# Patient Record
Sex: Male | Born: 1960 | Race: White | Hispanic: No | Marital: Single | Smoking: Former smoker
Health system: Southern US, Community
[De-identification: ages and names within clinical notes are randomized; demographics above are authoritative.]

## PROBLEM LIST (undated history)

## (undated) DIAGNOSIS — I10 Essential (primary) hypertension: Secondary | ICD-10-CM

## (undated) DIAGNOSIS — E119 Type 2 diabetes mellitus without complications: Secondary | ICD-10-CM

## (undated) HISTORY — PX: OTHER SURGICAL HISTORY: SHX169

---

## 2007-06-23 ENCOUNTER — Emergency Department: Payer: Self-pay | Admitting: Emergency Medicine

## 2007-06-23 ENCOUNTER — Other Ambulatory Visit: Payer: Self-pay

## 2010-09-25 ENCOUNTER — Ambulatory Visit: Payer: Self-pay | Admitting: Oncology

## 2010-10-03 ENCOUNTER — Ambulatory Visit: Payer: Self-pay | Admitting: Oncology

## 2010-10-07 ENCOUNTER — Ambulatory Visit: Payer: Self-pay | Admitting: Oncology

## 2010-10-08 ENCOUNTER — Ambulatory Visit: Payer: Self-pay | Admitting: Unknown Physician Specialty

## 2010-10-26 ENCOUNTER — Ambulatory Visit: Payer: Self-pay | Admitting: Oncology

## 2010-11-25 ENCOUNTER — Ambulatory Visit: Payer: Self-pay | Admitting: Oncology

## 2012-07-07 IMAGING — CT NM PET TUM IMG INITIAL (PI) SKULL BASE T - THIGH
5 series · 23 of 25 positions shown · non-contrast
Comparison: none

REASON FOR EXAM: carcinoma tongue
COMMENTS:

[Series 3: ct headneck 2.0 b31s · axial · 2.0mm · 0.98mm/px · z∈[-644,-364]mm · 8 of 188 slices shown]
[im 1/188]
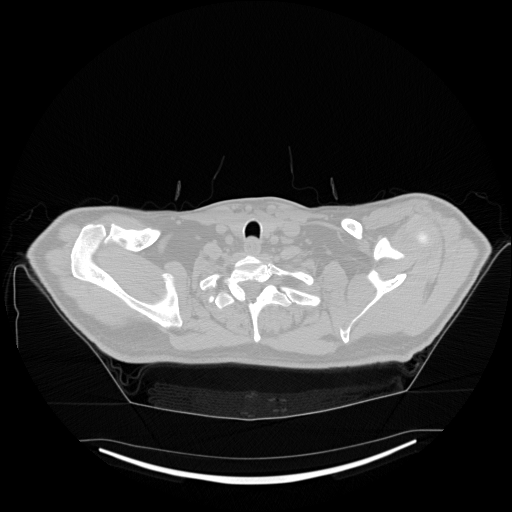
[im 24/188]
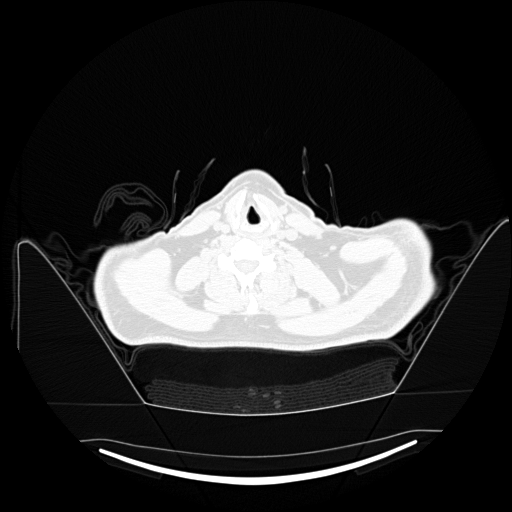
[im 47/188]
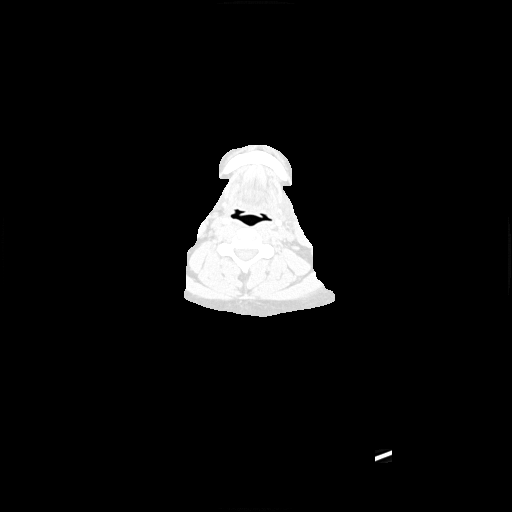
[im 71/188]
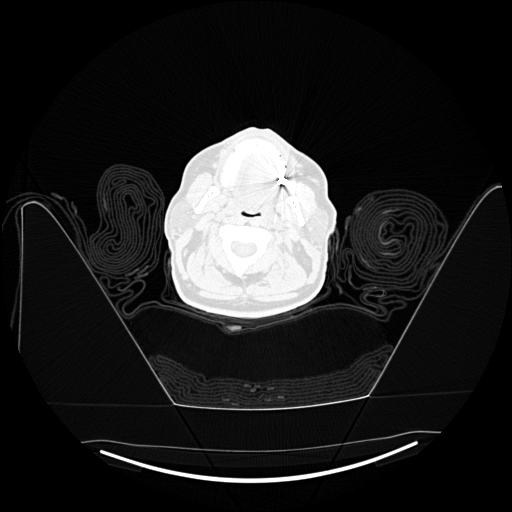
[im 94/188]
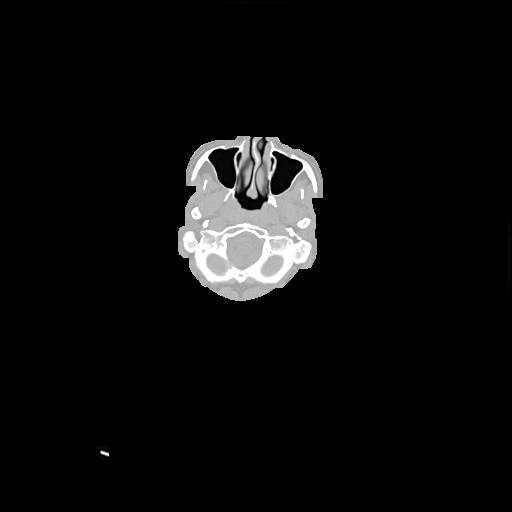
[im 117/188  brain]
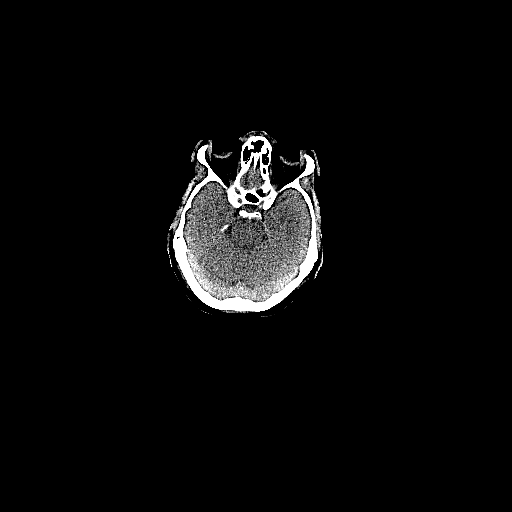
[im 164/188]
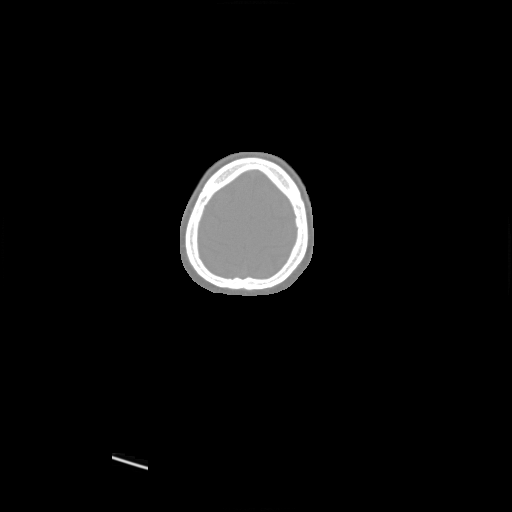
[im 188/188]
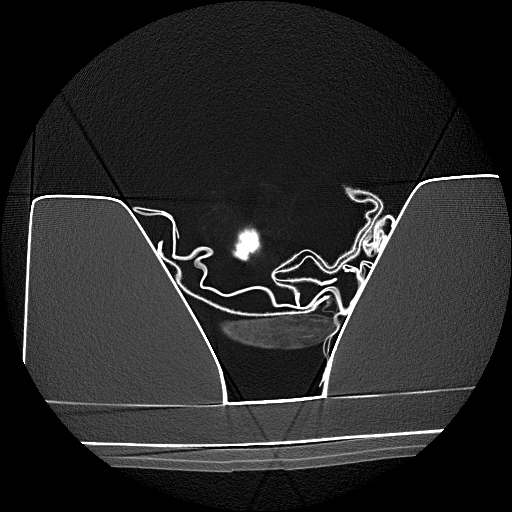

[Series 102: pet headneck · axial · 3.0mm · 2.67mm/px · z∈[-644,-364]mm · 7 of 141 slices shown]
[im 1/141]
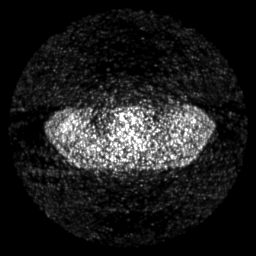
[im 24/141]
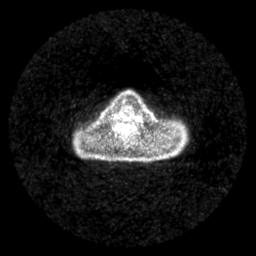
[im 47/141]
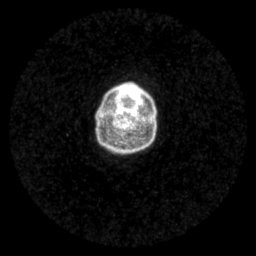
[im 71/141]
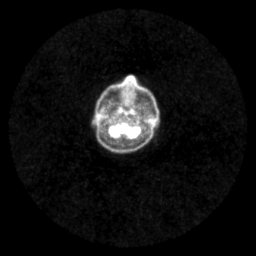
[im 94/141]
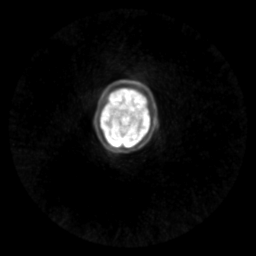
[im 117/141]
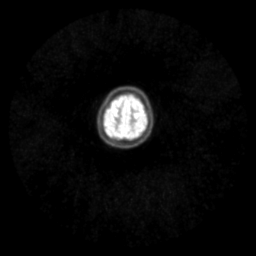
[im 141/141]
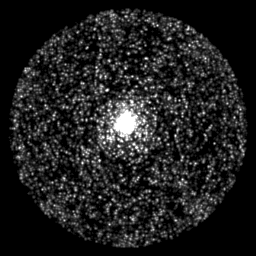

[Series 604: pet coronal head/neck · 2 of 63 slices shown]
[im 1/63]
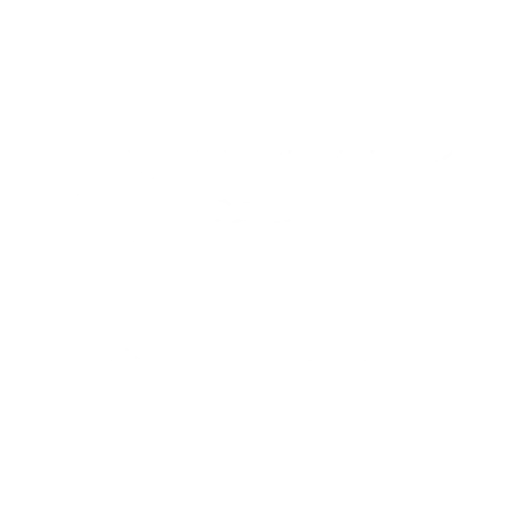
[im 32/63]
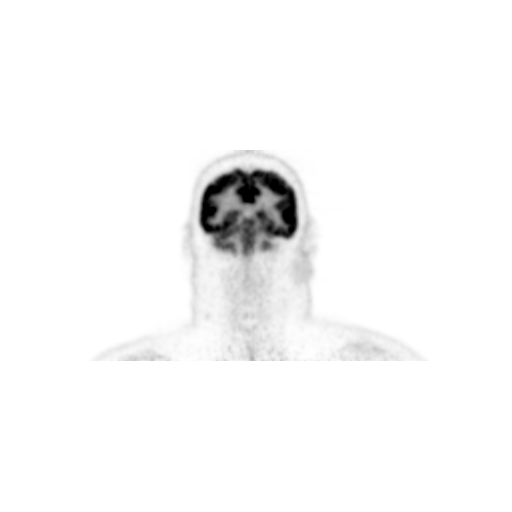

[Series 605: pet axial head/neck · 3 of 55 slices shown]
[im 1/55]
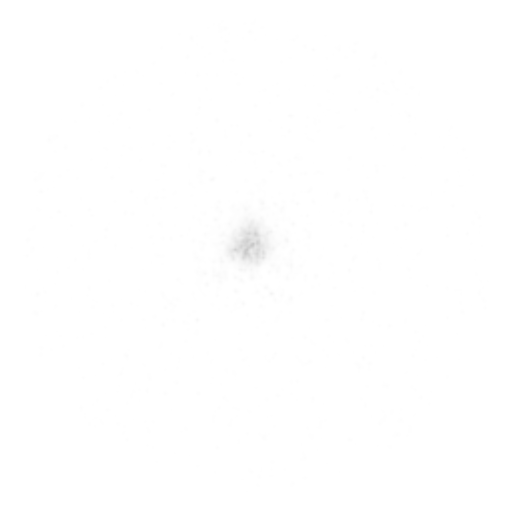
[im 28/55]
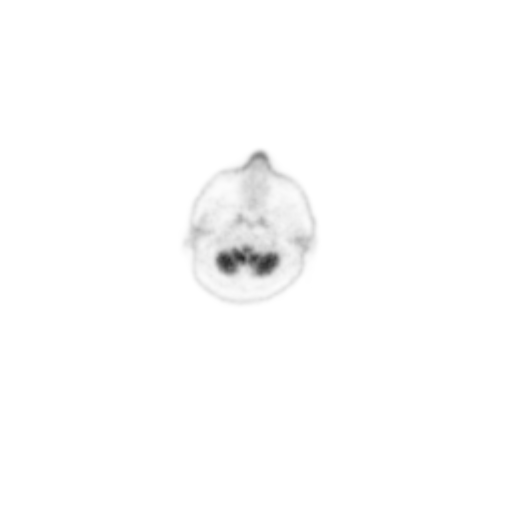
[im 55/55]
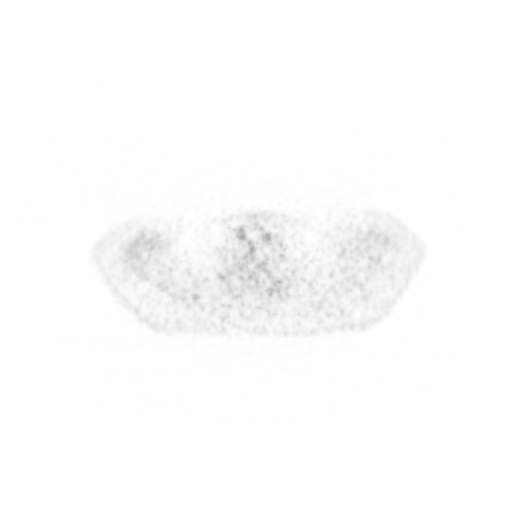

[Series 606: pet sagittal head/neck · 3 of 52 slices shown]
[im 1/52]
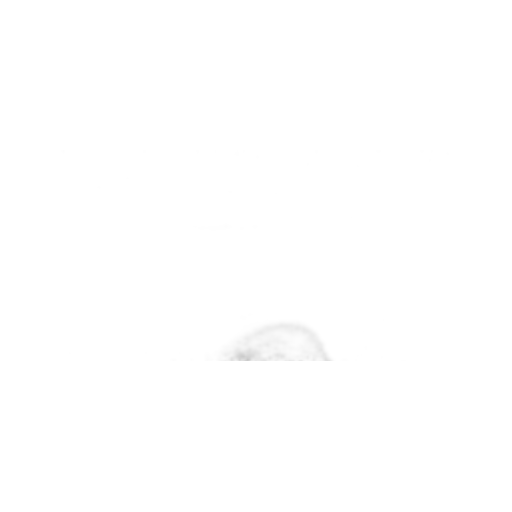
[im 26/52]
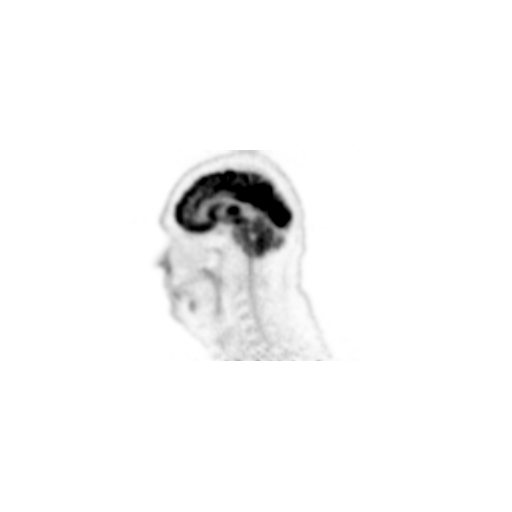
[im 52/52]
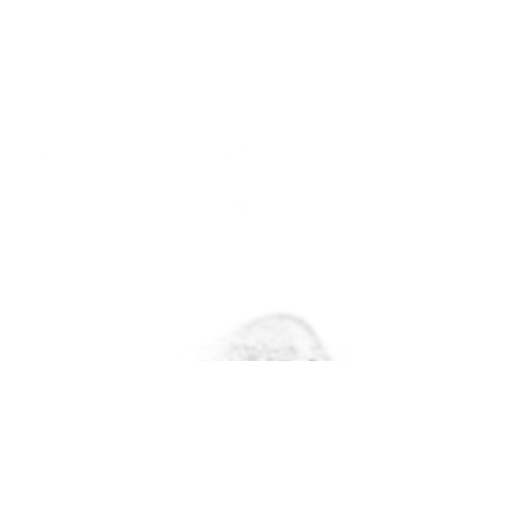

[23 of 25 positions shown; findings below may reference images not displayed]

PROCEDURE:     PET - PET/CT INIT STAG HEAD/NECK CA  - October 07, 2010 [DATE]

RESULT:     The patient is undergoing staging of lung malignancy. The
patient's fasting blood glucose level was 102 mg/dL. The patient received
13.09 mCi of F-18 labeled FDG at [DATE] a.m. with scanning beginning at [DATE]
a.m. and delayed scanning beginning at [DATE] a.m. A noncontrast CT scan was
performed for coregistration and attenuation correction.

There is a focus of fairly intensely increased uptake to the left of midline
and along the left anterior aspect of the tongue. It exhibits SUV of 11
maximally and 6.5 as a mean. I do not see significant abnormal uptake
elsewhere within the neck not see suspicious uptake within the soft tissues
of the neck to suggest lymphadenopathy on this initial sequence.
There is fairly symmetric uptake in the tonsillar and adenoidal regions
without evidence of masses. The maximal SUV of the right tonsillar region
6.3 with a mean of

Within the thorax I do not see abnormal uptake of the radiopharmaceutical.
Within the abdomen and pelvis is normal expected bowel and urinary tract
abnormality. I do not see abnormal uptake otherwise.

On delayed imaging there remains intensely increased uptake of the
radiopharmaceutical in the left anterior aspect of the tongue with a maximal
SUV of 17.1 mean of 11.5. There remains fairly symmetrically increased
uptake within the tonsillar regions. There is a small focus of increased
uptake just lateral to the left thyroid cartilage posteriorly. This lies
just medial to the pressure just inferior to the sternocleidomastoid muscle
which exhibits a maximal SUV 4 and mean of 3 this may reflect a
subcentimeter lymph node. not see suspicious uptake within the soft tissues
of the neck to suggest lymphadenopathy.

On the CT images there is mild hydronephrosis and hydroureter down to the
level of the ureterovesical junction on the right. The etiology for this is
not clear. At lung window settings I see no suspicious pulmonary nodules but
there is a subcentimeter nodule in the left lower lobe posterior laterally
on image 215.
IMPRESSION: 1. On the immediate and delayed images there is abnormal uptake of the
radiopharmaceutical in the left aspect of the tongue.
2. On both series there is also mildly increased uptake within the tonsillar
and adenoidal regions without evidence of discrete masses.
3. On the delayed images there is a tiny focus of increased uptake within a
subcentimeter lymph node deep to the left sternocleidomastoid muscle. This
is at approximately the the level of image 112.
4. I do not see abnormal uptake within the thorax or abdomen or pelvis.

## 2017-02-24 DIAGNOSIS — E119 Type 2 diabetes mellitus without complications: Secondary | ICD-10-CM

## 2017-02-24 DIAGNOSIS — I10 Essential (primary) hypertension: Secondary | ICD-10-CM

## 2017-02-24 HISTORY — DX: Type 2 diabetes mellitus without complications: E11.9

## 2017-02-24 HISTORY — DX: Essential (primary) hypertension: I10

## 2018-01-04 ENCOUNTER — Other Ambulatory Visit: Payer: Self-pay

## 2018-01-04 ENCOUNTER — Ambulatory Visit
Admission: RE | Admit: 2018-01-04 | Discharge: 2018-01-05 | Disposition: A | Discharge status: Home / Self Care | Payer: Managed Care, Other (non HMO) | Source: Ambulatory Visit | Attending: Internal Medicine | Admitting: Internal Medicine

## 2018-01-04 ENCOUNTER — Encounter
Admission: RE | Disposition: A | Discharge status: Home / Self Care | Payer: Self-pay | Source: Ambulatory Visit | Attending: Internal Medicine

## 2018-01-04 DIAGNOSIS — R079 Chest pain, unspecified: Secondary | ICD-10-CM | POA: Diagnosis present

## 2018-01-04 DIAGNOSIS — I2510 Atherosclerotic heart disease of native coronary artery without angina pectoris: Secondary | ICD-10-CM | POA: Diagnosis not present

## 2018-01-04 DIAGNOSIS — E782 Mixed hyperlipidemia: Secondary | ICD-10-CM | POA: Diagnosis not present

## 2018-01-04 DIAGNOSIS — I10 Essential (primary) hypertension: Secondary | ICD-10-CM | POA: Insufficient documentation

## 2018-01-04 DIAGNOSIS — Z8249 Family history of ischemic heart disease and other diseases of the circulatory system: Secondary | ICD-10-CM | POA: Diagnosis not present

## 2018-01-04 DIAGNOSIS — Z87891 Personal history of nicotine dependence: Secondary | ICD-10-CM | POA: Insufficient documentation

## 2018-01-04 DIAGNOSIS — E7800 Pure hypercholesterolemia, unspecified: Secondary | ICD-10-CM | POA: Diagnosis not present

## 2018-01-04 DIAGNOSIS — I208 Other forms of angina pectoris: Secondary | ICD-10-CM | POA: Diagnosis present

## 2018-01-04 DIAGNOSIS — I25119 Atherosclerotic heart disease of native coronary artery with unspecified angina pectoris: Secondary | ICD-10-CM | POA: Diagnosis not present

## 2018-01-04 HISTORY — PX: CORONARY STENT INTERVENTION: CATH118234

## 2018-01-04 HISTORY — DX: Essential (primary) hypertension: I10

## 2018-01-04 HISTORY — DX: Type 2 diabetes mellitus without complications: E11.9

## 2018-01-04 HISTORY — PX: LEFT HEART CATH AND CORONARY ANGIOGRAPHY: CATH118249

## 2018-01-04 LAB — POCT ACTIVATED CLOTTING TIME: Activated Clotting Time: 301 seconds

## 2018-01-04 LAB — GLUCOSE, CAPILLARY: GLUCOSE-CAPILLARY: 96 mg/dL (ref 70–99)

## 2018-01-04 SURGERY — LEFT HEART CATH AND CORONARY ANGIOGRAPHY
Anesthesia: Moderate Sedation

## 2018-01-04 MED ORDER — FENTANYL CITRATE (PF) 100 MCG/2ML IJ SOLN
INTRAMUSCULAR | Status: AC
  Filled 2018-01-04: qty 2

## 2018-01-04 MED ORDER — CLOPIDOGREL BISULFATE 300 MG PO TABS
ORAL_TABLET | ORAL | Status: AC
  Filled 2018-01-04: qty 2

## 2018-01-04 MED ORDER — ASPIRIN 81 MG PO CHEW
CHEWABLE_TABLET | ORAL | Status: DC | PRN
  Administered 2018-01-04: 324 mg via ORAL

## 2018-01-04 MED ORDER — SODIUM CHLORIDE 0.9% FLUSH: 3.0000 mL | Freq: Two times a day (BID) | INTRAVENOUS | Status: DC

## 2018-01-04 MED ORDER — IOPAMIDOL (ISOVUE-300) INJECTION 61%
INTRAVENOUS | Status: DC | PRN
  Administered 2018-01-04: 75 mL via INTRA_ARTERIAL

## 2018-01-04 MED ORDER — SODIUM CHLORIDE 0.9 % WEIGHT BASED INFUSION: 1.0000 mL/kg/h | INTRAVENOUS | Status: DC

## 2018-01-04 MED ORDER — HEPARIN (PORCINE) IN NACL 1000-0.9 UT/500ML-% IV SOLN
INTRAVENOUS | Status: AC
  Filled 2018-01-04: qty 1000

## 2018-01-04 MED ORDER — HEPARIN SODIUM (PORCINE) 1000 UNIT/ML IJ SOLN
INTRAMUSCULAR | Status: AC
  Filled 2018-01-04: qty 1

## 2018-01-04 MED ORDER — VERAPAMIL HCL 2.5 MG/ML IV SOLN
INTRAVENOUS | Status: AC
  Filled 2018-01-04: qty 2

## 2018-01-04 MED ORDER — IOPAMIDOL (ISOVUE-300) INJECTION 61%
INTRAVENOUS | Status: DC | PRN
  Administered 2018-01-04: 110 mL via INTRA_ARTERIAL

## 2018-01-04 MED ORDER — SODIUM CHLORIDE 0.9 % WEIGHT BASED INFUSION
3.0000 mL/kg/h | INTRAVENOUS | Status: DC
  Administered 2018-01-04: 3 mL/kg/h via INTRAVENOUS

## 2018-01-04 MED ORDER — ONDANSETRON HCL 4 MG/2ML IJ SOLN: 4.0000 mg | Freq: Four times a day (QID) | INTRAMUSCULAR | Status: DC | PRN

## 2018-01-04 MED ORDER — MIDAZOLAM HCL 2 MG/2ML IJ SOLN
INTRAMUSCULAR | Status: AC
  Filled 2018-01-04: qty 2

## 2018-01-04 MED ORDER — HEPARIN SODIUM (PORCINE) 1000 UNIT/ML IJ SOLN
INTRAMUSCULAR | Status: DC | PRN
  Administered 2018-01-04: 5000 [IU] via INTRAVENOUS

## 2018-01-04 MED ORDER — LOSARTAN POTASSIUM 50 MG PO TABS
50.0000 mg | ORAL_TABLET | Freq: Every day | ORAL | Status: DC
  Administered 2018-01-04 – 2018-01-05 (×2): 50 mg via ORAL
  Filled 2018-01-04 (×2): qty 1

## 2018-01-04 MED ORDER — LEVOTHYROXINE SODIUM 50 MCG PO TABS
175.0000 ug | ORAL_TABLET | Freq: Every day | ORAL | Status: DC
  Administered 2018-01-05: 175 ug via ORAL
  Filled 2018-01-04: qty 1

## 2018-01-04 MED ORDER — SODIUM CHLORIDE 0.9 % WEIGHT BASED INFUSION: 1.0000 mL/kg/h | INTRAVENOUS | Status: AC

## 2018-01-04 MED ORDER — MIDAZOLAM HCL 2 MG/2ML IJ SOLN
INTRAMUSCULAR | Status: DC | PRN
  Administered 2018-01-04: 1 mg via INTRAVENOUS

## 2018-01-04 MED ORDER — NITROGLYCERIN 1 MG/10 ML FOR IR/CATH LAB
INTRA_ARTERIAL | Status: DC | PRN
  Administered 2018-01-04: 200 ug via INTRA_ARTERIAL

## 2018-01-04 MED ORDER — SODIUM CHLORIDE 0.9 % IV SOLN: 250.0000 mL | INTRAVENOUS | Status: DC | PRN

## 2018-01-04 MED ORDER — FENTANYL CITRATE (PF) 100 MCG/2ML IJ SOLN
INTRAMUSCULAR | Status: DC | PRN
  Administered 2018-01-04: 50 ug via INTRAVENOUS

## 2018-01-04 MED ORDER — ROSUVASTATIN CALCIUM 10 MG PO TABS
10.0000 mg | ORAL_TABLET | Freq: Every day | ORAL | Status: DC
  Administered 2018-01-04: 10 mg via ORAL
  Filled 2018-01-04: qty 1

## 2018-01-04 MED ORDER — ACETAMINOPHEN 325 MG PO TABS: 650.0000 mg | ORAL_TABLET | ORAL | Status: DC | PRN

## 2018-01-04 MED ORDER — SODIUM CHLORIDE 0.9% FLUSH
3.0000 mL | Freq: Two times a day (BID) | INTRAVENOUS | Status: DC
  Administered 2018-01-05: 3 mL via INTRAVENOUS

## 2018-01-04 MED ORDER — SODIUM CHLORIDE 0.9% FLUSH: 3.0000 mL | INTRAVENOUS | Status: DC | PRN

## 2018-01-04 MED ORDER — CLOPIDOGREL BISULFATE 75 MG PO TABS
ORAL_TABLET | ORAL | Status: DC | PRN
  Administered 2018-01-04: 600 mg via ORAL

## 2018-01-04 MED ORDER — ASPIRIN 81 MG PO CHEW
CHEWABLE_TABLET | ORAL | Status: AC
  Filled 2018-01-04: qty 4

## 2018-01-04 MED ORDER — VITAMIN B-12 1000 MCG PO TABS
1000.0000 ug | ORAL_TABLET | ORAL | Status: DC
  Administered 2018-01-05: 1000 ug via ORAL
  Filled 2018-01-04: qty 1

## 2018-01-04 MED ORDER — CARVEDILOL 3.125 MG PO TABS
3.1250 mg | ORAL_TABLET | Freq: Two times a day (BID) | ORAL | Status: DC
  Administered 2018-01-04 – 2018-01-05 (×2): 3.125 mg via ORAL
  Filled 2018-01-04 (×2): qty 1

## 2018-01-04 MED ORDER — HEPARIN SODIUM (PORCINE) 1000 UNIT/ML IJ SOLN
INTRAMUSCULAR | Status: DC | PRN
  Administered 2018-01-04: 5700 [IU] via INTRAVENOUS

## 2018-01-04 MED ORDER — ASPIRIN EC 81 MG PO TBEC
81.0000 mg | DELAYED_RELEASE_TABLET | Freq: Every day | ORAL | Status: DC
  Administered 2018-01-05: 81 mg via ORAL
  Filled 2018-01-04: qty 1

## 2018-01-04 MED ORDER — VERAPAMIL HCL 2.5 MG/ML IV SOLN
INTRAVENOUS | Status: DC | PRN
  Administered 2018-01-04: 2.5 mg via INTRA_ARTERIAL

## 2018-01-04 MED ORDER — CLOPIDOGREL BISULFATE 75 MG PO TABS
75.0000 mg | ORAL_TABLET | Freq: Every day | ORAL | Status: DC
  Administered 2018-01-05: 75 mg via ORAL
  Filled 2018-01-04: qty 1

## 2018-01-04 MED ORDER — NITROGLYCERIN 5 MG/ML IV SOLN
INTRAVENOUS | Status: AC
  Filled 2018-01-04: qty 10

## 2018-01-04 MED ORDER — ASPIRIN 81 MG PO CHEW: 81.0000 mg | CHEWABLE_TABLET | ORAL | Status: DC

## 2018-01-04 MED ORDER — CLOPIDOGREL BISULFATE 75 MG PO TABS: 75.0000 mg | ORAL_TABLET | Freq: Every day | ORAL | 4 refills | Status: DC

## 2018-01-04 SURGICAL SUPPLY — 14 items
BALLN ~~LOC~~ TREK RX 3.0X12 (BALLOONS) ×4
BALLOON ~~LOC~~ TREK RX 3.0X12 (BALLOONS) ×2 IMPLANT
CATH INFINITI 5 FR JL3.5 (CATHETERS) ×4 IMPLANT
CATH INFINITI 5FR ANG PIGTAIL (CATHETERS) ×4 IMPLANT
CATH INFINITI JR4 5F (CATHETERS) ×4 IMPLANT
CATH LAUNCHER 6FR EBU3.5 (CATHETERS) ×4 IMPLANT
DEVICE INFLAT 30 PLUS (MISCELLANEOUS) ×4 IMPLANT
DEVICE RAD COMP TR BAND LRG (VASCULAR PRODUCTS) ×4 IMPLANT
GLIDESHEATH SLEND SS 6F .021 (SHEATH) ×4 IMPLANT
KIT MANI 3VAL PERCEP (MISCELLANEOUS) ×4 IMPLANT
PACK CARDIAC CATH (CUSTOM PROCEDURE TRAY) ×4 IMPLANT
STENT RESOLUTE ONYX 3.0X15 (Permanent Stent) ×4 IMPLANT
WIRE ROSEN-J .035X260CM (WIRE) ×4 IMPLANT
WIRE RUNTHROUGH .014X180CM (WIRE) ×4 IMPLANT

## 2018-01-04 NOTE — Discharge Summary (Signed)
[facility] Cardiology Discharge Summary  Patient ID: NILO FALLIN MRN: [id number] DOB/AGE: Nov 14, 1960 57 y.o.  Admit date: 01/04/2018 Discharge date: 01/04/2018  Primary Discharge Diagnosis: Ischemic chest pain I20.9 Secondary Discharge Diagnosis high blood pressure and high cholesterol  Significant Diagnostic Studies: Cardiac cath with left ventricular angiogram and selective coronary injection as well as PCI and stent placement of 2nd obtuse marginal.  Hospital Course: The patient was admitted to specials for cardiac cath with selective coronary angiogram after full consent, risk and benefits explained, and time out called with all approprate details voiced and discussed. The patient has had progressive canadian class 3 angina with high probability risk stress test consistent with ischemic chest pain and or anginal equivalent with coronary artery risk factors including high blood pressure and high cholesterol. The procedure was performed without complication and it revealed normal left ventricular function with ejection fraction of 60%.  It was found that the patient had severe 1 vessel coronary atherosclerosis with significant 2nd obtuse marginal stenosis requiring further intervention. Therefore, the patient had a PCI and drug eluding stent placed without complication. The patient has been ambulating without further significant symptoms and has reached his maximal hospital benefit and will be discharged to home in good condition.  Cardiac rehabilitation has been discussed and recommended. Medication management of cardiovascular risk factors will be given post discharge and modified as an outpatient.   Discharge Exam: Blood pressure 127/75, pulse 99, temperature 97.7 F (36.5 C), temperature source Oral, resp. rate 19, height 5\' 6"  (1.676 m), weight 112.5 kg, SpO2 97 %.  Constitutional: Alet oriented to person, place, and time. No distress.  HENT: No nasal discharge.  Head:  Normocephalic and atraumatic.  Eyes: Pupils are equal and round. No discharge.  Neck: Normal range of motion. Neck supple. No JVD present. No thyromegaly present.  Cardiovascular: Normal rate, regular rhythm, normal S1 S2, no gallop, no friction rub. No murmur Pulmonary/Chest: Effort normal, No stridor. No respiratory distress. no wheezes.  no rales.    Abdominal: Soft. Bowel sounds are normal.  no distension.  no tenderness. There is no rebound and no guarding.  Musculoskeletal: No edema, no cyanosis, normal pulses, no bleeding, Normal range of motion. no tenderness.  Neurological:  alert and oriented to person, place, and time. Coordination normal.  Skin: Skin is warm and dry. No rash noted. No erythema. No pallor.  Psychiatric:  normal mood and affect. behavior is normal.    Labs:  No results found for: WBC, HGB, HCT, MCV, PLT No results for input(s): NA, K, CL, CO2, BUN, CREATININE, CALCIUM, PROT, BILITOT, ALKPHOS, ALT, AST, GLUCOSE in the last 168 hours.  Invalid input(s): LABALBU  EKG: NSR without evidence of new changes  FOLLOW UP IN ONE TO TWO WEEKS Discharge Instructions    AMB Referral to Cardiac Rehabilitation - Phase II   Complete by:  As directed    Diagnosis:  Coronary Stents     Allergies as of 01/04/2018   No Known Allergies     Medication List    TAKE these medications   aspirin EC 81 MG tablet Take 81 mg by mouth daily.   clopidogrel 75 MG tablet Commonly known as:  PLAVIX Take 1 tablet (75 mg total) by mouth daily with breakfast. Start taking on:  01/05/2018   levothyroxine 175 MCG tablet Commonly known as:  SYNTHROID, LEVOTHROID Take 175 mcg by mouth daily before breakfast.   losartan 50 MG tablet Commonly known as:  COZAAR Take 50  mg by mouth daily.   metFORMIN 500 MG 24 hr tablet Commonly known as:  GLUCOPHAGE-XR Take 500 mg by mouth daily with breakfast.   vitamin B-12 1000 MCG tablet Commonly known as:  CYANOCOBALAMIN Take 1,000 mcg by  mouth every other day.        THE PATIENT  SHALL BRING ALL MEDICATIONS TO FOLLOW UP APPOINTMENT  Signed:  Lamar Blinks MD, [facility] 01/04/2018, 5:01 PM

## 2018-01-04 NOTE — Progress Notes (Signed)
Dr. Gwen Pounds in at bedside speaking with pt. And family re: cath/PCI results. Both verbalized understanding of instructions.

## 2018-01-05 ENCOUNTER — Encounter: Payer: Self-pay | Admitting: Internal Medicine

## 2018-01-05 DIAGNOSIS — I25119 Atherosclerotic heart disease of native coronary artery with unspecified angina pectoris: Secondary | ICD-10-CM | POA: Diagnosis not present

## 2018-01-05 LAB — CBC
HEMATOCRIT: 45.4 % (ref 39.0–52.0)
Hemoglobin: 14.9 g/dL (ref 13.0–17.0)
MCH: 31.4 pg (ref 26.0–34.0)
MCHC: 32.8 g/dL (ref 30.0–36.0)
MCV: 95.8 fL (ref 80.0–100.0)
NRBC: 0 % (ref 0.0–0.2)
Platelets: 244 10*3/uL (ref 150–400)
RBC: 4.74 MIL/uL (ref 4.22–5.81)
RDW: 12.5 % (ref 11.5–15.5)
WBC: 9 10*3/uL (ref 4.0–10.5)

## 2018-01-05 LAB — BASIC METABOLIC PANEL
ANION GAP: 6 (ref 5–15)
BUN: 15 mg/dL (ref 6–20)
CHLORIDE: 106 mmol/L (ref 98–111)
CO2: 25 mmol/L (ref 22–32)
Calcium: 8.7 mg/dL — ABNORMAL LOW (ref 8.9–10.3)
Creatinine, Ser: 1.03 mg/dL (ref 0.61–1.24)
GFR calc non Af Amer: >60 mL/min (ref >60)
GLUCOSE: 153 mg/dL — AB (ref 70–99)
Potassium: 4 mmol/L (ref 3.5–5.1)
Sodium: 137 mmol/L (ref 135–145)

## 2018-01-05 MED ORDER — LOSARTAN POTASSIUM 50 MG PO TABS: 50.0000 mg | ORAL_TABLET | Freq: Every day | ORAL | 4 refills | Status: DC

## 2018-01-05 MED ORDER — ROSUVASTATIN CALCIUM 10 MG PO TABS: 10.0000 mg | ORAL_TABLET | Freq: Every day | ORAL | 4 refills | Status: DC

## 2018-01-05 MED ORDER — CARVEDILOL 3.125 MG PO TABS: 3.1250 mg | ORAL_TABLET | Freq: Two times a day (BID) | ORAL | 4 refills | Status: DC

## 2018-01-05 MED ORDER — LEVOTHYROXINE SODIUM 175 MCG PO TABS: 175.0000 ug | ORAL_TABLET | Freq: Every day | ORAL | 4 refills | Status: DC

## 2018-01-05 NOTE — Progress Notes (Signed)
IVs and tele removed from patient. Discharge instructions given to patient, verbalized understanding. No acute distress at this time. Friend at bedside will transport patient home.

## 2020-02-02 ENCOUNTER — Other Ambulatory Visit: Payer: Managed Care, Other (non HMO) | Attending: Internal Medicine

## 2020-02-06 ENCOUNTER — Other Ambulatory Visit: Payer: Managed Care, Other (non HMO)

## 2020-02-06 ENCOUNTER — Ambulatory Visit
Admission: RE | Admit: 2020-02-06 | Payer: Managed Care, Other (non HMO) | Source: Home / Self Care | Admitting: Internal Medicine

## 2020-02-06 ENCOUNTER — Encounter: Admission: RE | Payer: Self-pay | Source: Home / Self Care

## 2020-02-06 SURGERY — COLONOSCOPY WITH PROPOFOL
Anesthesia: General

## 2021-06-06 ENCOUNTER — Other Ambulatory Visit: Payer: Self-pay | Admitting: Physician Assistant

## 2021-06-06 ENCOUNTER — Other Ambulatory Visit (HOSPITAL_COMMUNITY): Payer: Self-pay | Admitting: Physician Assistant

## 2021-06-06 DIAGNOSIS — M5416 Radiculopathy, lumbar region: Secondary | ICD-10-CM

## 2021-06-13 ENCOUNTER — Ambulatory Visit
Admission: RE | Admit: 2021-06-13 | Discharge: 2021-06-13 | Disposition: A | Discharge status: Home / Self Care | Payer: Managed Care, Other (non HMO) | Source: Ambulatory Visit | Attending: Physician Assistant | Admitting: Physician Assistant

## 2021-06-13 DIAGNOSIS — M5416 Radiculopathy, lumbar region: Secondary | ICD-10-CM | POA: Diagnosis present

## 2021-09-27 ENCOUNTER — Ambulatory Visit (INDEPENDENT_AMBULATORY_CARE_PROVIDER_SITE_OTHER): Payer: Managed Care, Other (non HMO) | Admitting: Neurosurgery

## 2021-09-27 DIAGNOSIS — G8929 Other chronic pain: Secondary | ICD-10-CM

## 2021-09-27 DIAGNOSIS — M545 Low back pain, unspecified: Secondary | ICD-10-CM

## 2021-09-27 NOTE — Progress Notes (Signed)
I spoke with Mr. Laseter to review his progress with therapy.  He has undergone about 4 sessions and thinks he has about 3 weeks left.  While he has had some improvement he does continue to have back pain particularly when walking and carrying an object in front of him.  He also does have some intermittent numbness in his legs however this is not as significant as back pain.  We discussed further treatment options including possible facet injections versus continue with physical therapy.  He would like to finish out therapy and will let us know if he is not significantly better and would like to consider an injection.  I will otherwise see him going forward on an as-needed basis.  He was encouraged to call the office should he have any questions or concerns.  Manning Charity PA-C Neurosurgery

## 2023-03-18 ENCOUNTER — Other Ambulatory Visit: Payer: Self-pay

## 2023-03-18 ENCOUNTER — Inpatient Hospital Stay (HOSPITAL_COMMUNITY)
Admission: EM | Admit: 2023-03-18 | Discharge: 2023-03-21 | DRG: 321 | Disposition: A | Discharge status: Home / Self Care | Payer: Managed Care, Other (non HMO) | Attending: Internal Medicine | Admitting: Internal Medicine

## 2023-03-18 ENCOUNTER — Encounter (HOSPITAL_COMMUNITY): Payer: Self-pay

## 2023-03-18 ENCOUNTER — Emergency Department (HOSPITAL_COMMUNITY): Payer: Managed Care, Other (non HMO)

## 2023-03-18 DIAGNOSIS — Z885 Allergy status to narcotic agent status: Secondary | ICD-10-CM

## 2023-03-18 DIAGNOSIS — Z79899 Other long term (current) drug therapy: Secondary | ICD-10-CM

## 2023-03-18 DIAGNOSIS — E039 Hypothyroidism, unspecified: Secondary | ICD-10-CM | POA: Diagnosis present

## 2023-03-18 DIAGNOSIS — Z888 Allergy status to other drugs, medicaments and biological substances status: Secondary | ICD-10-CM

## 2023-03-18 DIAGNOSIS — E1169 Type 2 diabetes mellitus with other specified complication: Secondary | ICD-10-CM

## 2023-03-18 DIAGNOSIS — E876 Hypokalemia: Secondary | ICD-10-CM | POA: Diagnosis present

## 2023-03-18 DIAGNOSIS — Z87891 Personal history of nicotine dependence: Secondary | ICD-10-CM

## 2023-03-18 DIAGNOSIS — E8809 Other disorders of plasma-protein metabolism, not elsewhere classified: Secondary | ICD-10-CM | POA: Diagnosis present

## 2023-03-18 DIAGNOSIS — Z1152 Encounter for screening for COVID-19: Secondary | ICD-10-CM

## 2023-03-18 DIAGNOSIS — I2510 Atherosclerotic heart disease of native coronary artery without angina pectoris: Secondary | ICD-10-CM | POA: Diagnosis present

## 2023-03-18 DIAGNOSIS — J440 Chronic obstructive pulmonary disease with acute lower respiratory infection: Secondary | ICD-10-CM | POA: Diagnosis present

## 2023-03-18 DIAGNOSIS — E785 Hyperlipidemia, unspecified: Secondary | ICD-10-CM | POA: Diagnosis present

## 2023-03-18 DIAGNOSIS — E66813 Obesity, class 3: Secondary | ICD-10-CM | POA: Diagnosis present

## 2023-03-18 DIAGNOSIS — E1165 Type 2 diabetes mellitus with hyperglycemia: Secondary | ICD-10-CM | POA: Diagnosis present

## 2023-03-18 DIAGNOSIS — I10 Essential (primary) hypertension: Secondary | ICD-10-CM

## 2023-03-18 DIAGNOSIS — Z79890 Hormone replacement therapy (postmenopausal): Secondary | ICD-10-CM

## 2023-03-18 DIAGNOSIS — Z791 Long term (current) use of non-steroidal anti-inflammatories (NSAID): Secondary | ICD-10-CM

## 2023-03-18 DIAGNOSIS — Z7984 Long term (current) use of oral hypoglycemic drugs: Secondary | ICD-10-CM | POA: Diagnosis not present

## 2023-03-18 DIAGNOSIS — Z6841 Body Mass Index (BMI) 40.0 and over, adult: Secondary | ICD-10-CM

## 2023-03-18 DIAGNOSIS — Z7902 Long term (current) use of antithrombotics/antiplatelets: Secondary | ICD-10-CM | POA: Diagnosis not present

## 2023-03-18 DIAGNOSIS — I4510 Unspecified right bundle-branch block: Secondary | ICD-10-CM | POA: Diagnosis present

## 2023-03-18 DIAGNOSIS — J189 Pneumonia, unspecified organism: Secondary | ICD-10-CM

## 2023-03-18 DIAGNOSIS — I214 Non-ST elevation (NSTEMI) myocardial infarction: Principal | ICD-10-CM

## 2023-03-18 DIAGNOSIS — Z794 Long term (current) use of insulin: Secondary | ICD-10-CM

## 2023-03-18 DIAGNOSIS — Z9861 Coronary angioplasty status: Secondary | ICD-10-CM | POA: Diagnosis not present

## 2023-03-18 DIAGNOSIS — E1151 Type 2 diabetes mellitus with diabetic peripheral angiopathy without gangrene: Secondary | ICD-10-CM | POA: Diagnosis present

## 2023-03-18 DIAGNOSIS — Z7982 Long term (current) use of aspirin: Secondary | ICD-10-CM

## 2023-03-18 LAB — PROCALCITONIN: Procalcitonin: <0.1 ng/mL

## 2023-03-18 LAB — HEMOGLOBIN A1C
Hgb A1c MFr Bld: 6.3 % — ABNORMAL HIGH (ref 4.8–5.6)
Mean Plasma Glucose: 134.11 mg/dL

## 2023-03-18 LAB — HEPARIN LEVEL (UNFRACTIONATED): Heparin Unfractionated: 0.22 [IU]/mL — ABNORMAL LOW (ref 0.30–0.70)

## 2023-03-18 LAB — CBC
HCT: 41.7 % (ref 39.0–52.0)
Hemoglobin: 14.3 g/dL (ref 13.0–17.0)
MCH: 32.9 pg (ref 26.0–34.0)
MCHC: 34.3 g/dL (ref 30.0–36.0)
MCV: 96.1 fL (ref 80.0–100.0)
Platelets: 245 10*3/uL (ref 150–400)
RBC: 4.34 MIL/uL (ref 4.22–5.81)
RDW: 12.6 % (ref 11.5–15.5)
WBC: 17.8 10*3/uL — ABNORMAL HIGH (ref 4.0–10.5)
nRBC: 0 % (ref 0.0–0.2)

## 2023-03-18 LAB — BASIC METABOLIC PANEL
Anion gap: 11 (ref 5–15)
BUN: 16 mg/dL (ref 8–23)
CO2: 20 mmol/L — ABNORMAL LOW (ref 22–32)
Calcium: 8.7 mg/dL — ABNORMAL LOW (ref 8.9–10.3)
Chloride: 104 mmol/L (ref 98–111)
Creatinine, Ser: 1.05 mg/dL (ref 0.61–1.24)
GFR, Estimated: >60 mL/min (ref >60)
Glucose, Bld: 222 mg/dL — ABNORMAL HIGH (ref 70–99)
Potassium: 3.7 mmol/L (ref 3.5–5.1)
Sodium: 135 mmol/L (ref 135–145)

## 2023-03-18 LAB — GLUCOSE, CAPILLARY
Glucose-Capillary: 150 mg/dL — ABNORMAL HIGH (ref 70–99)
Glucose-Capillary: 135 mg/dL — ABNORMAL HIGH (ref 70–99)

## 2023-03-18 LAB — STREP PNEUMONIAE URINARY ANTIGEN: Strep Pneumo Urinary Antigen: NEGATIVE

## 2023-03-18 LAB — BRAIN NATRIURETIC PEPTIDE: B Natriuretic Peptide: 280.3 pg/mL — ABNORMAL HIGH (ref 0.0–100.0)

## 2023-03-18 LAB — D-DIMER, QUANTITATIVE: D-Dimer, Quant: 0.31 ug{FEU}/mL (ref 0.00–0.50)

## 2023-03-18 LAB — RESP PANEL BY RT-PCR (RSV, FLU A&B, COVID)  RVPGX2
Influenza A by PCR: NEGATIVE
Influenza B by PCR: NEGATIVE
Resp Syncytial Virus by PCR: NEGATIVE
SARS Coronavirus 2 by RT PCR: NEGATIVE

## 2023-03-18 LAB — HIV ANTIBODY (ROUTINE TESTING W REFLEX): HIV Screen 4th Generation wRfx: NONREACTIVE

## 2023-03-18 LAB — TROPONIN I (HIGH SENSITIVITY)
Troponin I (High Sensitivity): 3562 ng/L (ref <18)
Troponin I (High Sensitivity): 3530 ng/L (ref <18)

## 2023-03-18 LAB — C-REACTIVE PROTEIN: CRP: 3.9 mg/dL — ABNORMAL HIGH (ref <1.0)

## 2023-03-18 MED ORDER — LACTATED RINGERS IV BOLUS
1000.0000 mL | Freq: Once | INTRAVENOUS | Status: AC
  Administered 2023-03-18: 1000 mL via INTRAVENOUS

## 2023-03-18 MED ORDER — GLIPIZIDE ER 5 MG PO TB24: 5.0000 mg | ORAL_TABLET | Freq: Every day | ORAL | Status: DC

## 2023-03-18 MED ORDER — SODIUM CHLORIDE 0.9 % IV SOLN
1.0000 g | Freq: Once | INTRAVENOUS | Status: AC
  Administered 2023-03-18: 1 g via INTRAVENOUS
  Filled 2023-03-18: qty 10

## 2023-03-18 MED ORDER — HEPARIN (PORCINE) 25000 UT/250ML-% IV SOLN
1400.0000 [IU]/h | INTRAVENOUS | Status: DC
  Administered 2023-03-18: 1250 [IU]/h via INTRAVENOUS
  Administered 2023-03-19: 1400 [IU]/h via INTRAVENOUS
  Filled 2023-03-18 (×2): qty 250

## 2023-03-18 MED ORDER — ROSUVASTATIN CALCIUM 20 MG PO TABS
20.0000 mg | ORAL_TABLET | Freq: Every day | ORAL | Status: DC
Start: 2023-03-18 — End: 2023-03-21
  Administered 2023-03-18: 20 mg via ORAL
  Filled 2023-03-18: qty 1

## 2023-03-18 MED ORDER — HEPARIN BOLUS VIA INFUSION
4000.0000 [IU] | Freq: Once | INTRAVENOUS | Status: AC
  Administered 2023-03-18: 4000 [IU] via INTRAVENOUS
  Filled 2023-03-18: qty 4000

## 2023-03-18 MED ORDER — HYDRALAZINE HCL 20 MG/ML IJ SOLN
10.0000 mg | Freq: Four times a day (QID) | INTRAMUSCULAR | Status: DC | PRN
Start: 2023-03-18 — End: 2023-03-21

## 2023-03-18 MED ORDER — ACETAMINOPHEN 325 MG PO TABS: 650.0000 mg | ORAL_TABLET | Freq: Four times a day (QID) | ORAL | Status: DC | PRN

## 2023-03-18 MED ORDER — AMLODIPINE BESYLATE 2.5 MG PO TABS
2.5000 mg | ORAL_TABLET | Freq: Every day | ORAL | Status: DC
  Administered 2023-03-18 – 2023-03-21 (×4): 2.5 mg via ORAL
  Filled 2023-03-18 (×4): qty 1

## 2023-03-18 MED ORDER — ONDANSETRON HCL 4 MG/2ML IJ SOLN
4.0000 mg | Freq: Four times a day (QID) | INTRAMUSCULAR | Status: DC | PRN
  Administered 2023-03-20: 4 mg via INTRAVENOUS

## 2023-03-18 MED ORDER — INSULIN ASPART 100 UNIT/ML IJ SOLN: 0.0000 [IU] | Freq: Every day | INTRAMUSCULAR | Status: DC

## 2023-03-18 MED ORDER — SODIUM CHLORIDE 0.9 % IV SOLN
1.0000 g | INTRAVENOUS | Status: DC
  Administered 2023-03-19 – 2023-03-21 (×3): 1 g via INTRAVENOUS
  Filled 2023-03-18 (×3): qty 10

## 2023-03-18 MED ORDER — ACETAMINOPHEN 650 MG RE SUPP: 650.0000 mg | Freq: Four times a day (QID) | RECTAL | Status: DC | PRN

## 2023-03-18 MED ORDER — SODIUM CHLORIDE 0.9% FLUSH
3.0000 mL | Freq: Two times a day (BID) | INTRAVENOUS | Status: DC
  Administered 2023-03-18 – 2023-03-21 (×5): 3 mL via INTRAVENOUS

## 2023-03-18 MED ORDER — INSULIN ASPART 100 UNIT/ML IJ SOLN
0.0000 [IU] | Freq: Three times a day (TID) | INTRAMUSCULAR | Status: DC
  Administered 2023-03-18 – 2023-03-21 (×6): 2 [IU] via SUBCUTANEOUS

## 2023-03-18 MED ORDER — AZITHROMYCIN 500 MG IV SOLR
500.0000 mg | Freq: Once | INTRAVENOUS | Status: AC
  Administered 2023-03-18: 500 mg via INTRAVENOUS
  Filled 2023-03-18: qty 5

## 2023-03-18 MED ORDER — SODIUM CHLORIDE 0.9 % IV SOLN
500.0000 mg | INTRAVENOUS | Status: DC
  Administered 2023-03-19 – 2023-03-21 (×3): 500 mg via INTRAVENOUS
  Filled 2023-03-18 (×3): qty 5

## 2023-03-18 MED ORDER — ASPIRIN 325 MG PO TABS
325.0000 mg | ORAL_TABLET | Freq: Every day | ORAL | Status: DC
  Administered 2023-03-18: 325 mg via ORAL
  Filled 2023-03-18 (×2): qty 1

## 2023-03-18 MED ORDER — LEVOTHYROXINE SODIUM 75 MCG PO TABS
175.0000 ug | ORAL_TABLET | Freq: Every day | ORAL | Status: DC
  Administered 2023-03-19 – 2023-03-21 (×3): 175 ug via ORAL
  Filled 2023-03-18 (×3): qty 1

## 2023-03-18 MED ORDER — ONDANSETRON HCL 4 MG PO TABS
4.0000 mg | ORAL_TABLET | Freq: Four times a day (QID) | ORAL | Status: AC | PRN
Start: 2023-03-18 — End: ?

## 2023-03-18 MED ORDER — ALBUTEROL SULFATE (2.5 MG/3ML) 0.083% IN NEBU: 3.0000 mL | INHALATION_SOLUTION | RESPIRATORY_TRACT | Status: DC | PRN

## 2023-03-18 NOTE — ED Notes (Signed)
Called floor and gave report to receiving RN. RN will be ready to accept PT in about 15 min.

## 2023-03-18 NOTE — H&P (Signed)
History and Physical    Patient: James Guzman:[id number] DOB: 1960-03-19 DOA: 03/18/2023 DOS: the patient was seen and examined on 03/18/2023 PCP: James Peak, PA-C  Patient coming from: Home Medical readiness/disposition: He will remain in the hospital until his pneumonia has improved enough that he can undergo cardiac catheterization.  Once cleared by cardiology for discharge he will discharge back to home.  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    Chief Complaint:  Chief Complaint  Patient presents with   Chest Pain   Shortness of Breath   HPI: James Guzman is a 63 y.o. male with medical history significant of hypertension, obesity with a BMI of 44, dyslipidemia, diabetes mellitus 2 on oral agents, hypothyroidism, PVD and CAD status post stent in 2019.  Patient presented to the ED from his PCP office.He reports a 3-week history of upper respiratory symptoms that have worsened over the past 7 days.  Symptoms include pleuritic chest pain while coughing, shortness of breath with exertion as well as orthopnea.  His PCP noted a new right bundle branch block and was concerned over a PE therefore the patient was sent to the ED for further evaluation and treatment.  In the ED he was not hypoxemic although O2 sats were around 95%.  D-dimer was normal.  Chest x-ray did demonstrate right-sided opacities.  He did have leukocytosis with a white count of 17,800.  Differential was not obtained.  Respiratory panel PCR for influenza, RSV and COVID were negative.  Because of his cardiac history and reports of chest pain troponin was checked and this was elevated at 3562 and 3530.  EKG without any acute ischemic changes and patient was denying chest pain.  BNP slightly elevated at 280 but imaging not consistent with volume overload/CHF.  In regards to his pneumonia he has been started on IV Rocephin and Zithromax.  Patient's shortness of breath  and chest pain which is pleuritic seem to be more related to infection and pneumonia as opposed to CHF exacerbation.  Patient does not have a history of CHF.  In regards to his cardiac symptoms again he has not been having any chest pain.  Patient reported that in 2019 he had been having some shortness of breath with exertion and underwent a stress test and subsequently underwent a cardiac catheterization and stent placement.  Prior to onset of respiratory symptoms he has not had any shortness of breath.  Cardiology has been consulted and plans to proceed with left heart cath this admission after patient's pulmonary symptoms have improved.  Because of his underlying pneumonia and multiple medical problems hospitalist service has been asked to evaluate the patient for admission.   Review of Systems: As mentioned in the history of present illness. All other systems reviewed and are negative. Past Medical History:  Diagnosis Date   Diabetes mellitus (HCC) 2019   Hypertension 2019   Past Surgical History:  Procedure Laterality Date   CORONARY STENT INTERVENTION N/A 01/04/2018   Procedure: CORONARY STENT INTERVENTION;  Surgeon: Iran Ouch, MD;  Location: [facility] INVASIVE CV LAB;  Service: Cardiovascular;  Laterality: N/A;   LEFT HEART CATH AND CORONARY ANGIOGRAPHY Left 01/04/2018   Procedure: LEFT HEART CATH AND CORONARY ANGIOGRAPHY;  Surgeon: Lamar Blinks, MD;  Location: [facility] INVASIVE CV LAB;  Service: Cardiovascular;  Laterality: Left;   Pin Placement: Arm Left    63 y.o.    Social History:  reports that  he quit smoking about 14 years ago. His smoking use included cigarettes. He has never used smokeless tobacco. He reports that he does not currently use alcohol. He reports that he does not currently use drugs after having used the following drugs: Marijuana and Cocaine.  No Known Allergies  History reviewed. No pertinent family history.  Prior to Admission medications   Medication  Sig Start Date End Date Taking? Authorizing Provider  albuterol (VENTOLIN HFA) 108 (90 Base) MCG/ACT inhaler Inhale into the lungs. 03/16/23   [provider]  amLODipine (NORVASC) 10 MG tablet 1 tablet. 08/15/22   [provider]  aspirin EC 81 MG tablet Take 81 mg by mouth daily.    [provider]  carvedilol (COREG) 3.125 MG tablet Take 1 tablet (3.125 mg total) by mouth 2 (two) times daily with a meal. 01/05/18   Lamar Blinks, MD  Cetirizine HCl 10 MG CAPS Take by mouth.    [provider]  clopidogrel (PLAVIX) 75 MG tablet Take 1 tablet (75 mg total) by mouth daily with breakfast. 01/05/18   Lamar Blinks, MD  doxycycline (VIBRAMYCIN) 100 MG capsule Take 100 mg by mouth 2 (two) times daily. 03/16/23   [provider]  glipiZIDE (GLUCOTROL XL) 5 MG 24 hr tablet Take 5 mg by mouth daily. 02/17/23   [provider]  levothyroxine (SYNTHROID, LEVOTHROID) 175 MCG tablet Take 175 mcg by mouth daily before breakfast.    [provider]  levothyroxine (SYNTHROID, LEVOTHROID) 175 MCG tablet Take 1 tablet (175 mcg total) by mouth daily before breakfast. 01/06/18   Lamar Blinks, MD  losartan (COZAAR) 50 MG tablet Take 50 mg by mouth daily.    [provider]  losartan (COZAAR) 50 MG tablet Take 1 tablet (50 mg total) by mouth daily. 01/06/18   Lamar Blinks, MD  meloxicam (MOBIC) 15 MG tablet Take 15 mg by mouth once daily 12/12/19   [provider]  metFORMIN (GLUCOPHAGE-XR) 500 MG 24 hr tablet Take 500 mg by mouth daily with breakfast.    [provider]  predniSONE (DELTASONE) 20 MG tablet Take 20 mg by mouth 2 (two) times daily. 03/16/23   [provider]  rosuvastatin (CRESTOR) 10 MG tablet Take 1 tablet (10 mg total) by mouth daily at 6 PM. 01/05/18   Lamar Blinks, MD  vitamin B-12 (CYANOCOBALAMIN) 1000 MCG tablet Take 1,000 mcg by mouth every other day.    [provider]     Physical Exam: Vitals:   03/18/23 1332 03/18/23 1338 03/18/23 1430 03/18/23 1616  BP: (!) 147/81  (!) 148/81   Pulse: (!) 103  (!) 110   Resp: (!) 29  20   Temp:    99 F (37.2 C)  TempSrc:    Oral  SpO2: 95%  97%   Weight:  122.5 kg    Height:  5\' 6"  (1.676 m)     Constitutional: NAD, calm, comfortable, appears stated age Eyes: PERRL, lids and conjunctivae normal although patient does have bilateral scleral injection ENMT: Mucous membranes are dry, Posterior pharynx clear of any exudate or lesions.Normal dentition.  Neck: supple, no masses, no thyromegaly Respiratory: On posterior exam patient has combination of subtle rhonchi as well as crackles on the right side from the mid fields down.  Normal respiratory effort. No accessory muscle use.  At rest stable on room air with sats 95 to 96%.  Exertional sats not obtained. Cardiovascular: Regular rate and rhythm,  no murmurs / rubs / gallops. No extremity edema. 2+ pedal pulses. No carotid bruits.  Abdomen: Obese abdominal girth, no tenderness, no masses palpated. No obvious hepatosplenomegaly. Bowel sounds positive.  Musculoskeletal: no clubbing / cyanosis. No joint deformity upper and lower extremities. Good ROM, no contractures. Normal muscle tone.  Skin: no rashes, lesions, ulcers. No induration Neurologic: CN 2-12 grossly intact. Sensation intact,Strength 5/5 x all 4 extremities.  Psychiatric: Normal judgment and insight. Alert and oriented x 3. Normal mood.    Data Reviewed:  Sodium 135, potassium 3.7, chloride 104, CO2 20, glucose 222, BUN 16, creatinine 1.05, anion gap 11  BNP 280 Troponin 3562 > 3530  WBC 17,800-differential not obtained, hemoglobin 14.3, platelets 245,000  D-dimer 0.31  2 view chest x-ray: Patchy right lower lobe and right middle lobe opacity suspicious for pneumonia in the acute setting.  Assessment and Plan: Non-STEMI/history of CAD and prior coronary stent to OM 2 in 2019 Patient was sent to  the ED by his PCP because of shortness of breath and chest pain in context of new right bundle branch block Was found to have elevated troponin x 2 greater than 3000 -continue to trend Cardiology has consulted and plan is to pursue left heart cath this admission Continue IV heparin and full dose aspirin; preadmission Plavix on hold Patient reports last stress test several months ago.  He reports his initial cardiac ischemic equivalent was shortness of breath and dyspnea on exertion-he reports had not been having the symptoms prior to onset of his URI Echocardiogram and lipid panel  Right-sided community-acquired pneumonia/question history of COPD Currently stable on room air but ambulatory O2 sats have not been obtained Continue IV Rocephin and Zithromax; patient had been taking doxycycline prior to admission starting on 03/16/2023 as well as prednisone Dosepak Obtain CRP, procalcitonin, urinary strep antigen and if possible expectorated sputum culture Incentive spirometry and early mobilization O2 if needed for sats less than or equal to 94% Resume home albuterol MDI  Hypertension Cardiology has continued Norvasc but at a lower dose Will provide IV Apresoline as needed for SBP greater than 180  Diabetes mellitus 2 with hyperglycemia Glucose greater than 200 at presentation and likely due to acute illness Check hemoglobin A1c Follow CBGs and provide SSI Hold home metformin especially in context of upcoming left heart cath Resume preadmission Glucotrol  Dyslipidemia Documented a statin and Zetia intolerant Lipid panel pending Cardiology documents will discuss further options in the future  Hypothyroidism Continue Synthroid  PVD History of claudication Continue efforts to control diabetes and lipid status Former smoker  Obesity BMI 44 PCP to discuss weight reduction strategies Most of patient's weight is centrally located in the abdomen which can cause mechanical issues with  breathing    Advance Care Planning:   Code Status: Full Code   DVT prophylaxis: IV heparin  Consults: Cardiology  Family Communication: Patient only  Severity of Illness: The appropriate patient status for this patient is INPATIENT. Inpatient status is judged to be reasonable and necessary in order to provide the required intensity of service to ensure the patient's safety. The patient's presenting symptoms, physical exam findings, and initial radiographic and laboratory data in the context of their chronic comorbidities is felt to place them at high risk for further clinical deterioration. Furthermore, it is not anticipated that the patient will be medically stable for discharge from the hospital within 2 midnights of admission.   * I certify that at the point of admission it is my  clinical judgment that the patient will require inpatient hospital care spanning beyond 2 midnights from the point of admission due to high intensity of service, high risk for further deterioration and high frequency of surveillance required.*  Author: Junious Silk, NP 03/18/2023 4:21 PM  For on call review [email or web address].

## 2023-03-18 NOTE — ED Provider Triage Note (Signed)
Emergency Medicine Provider Triage Evaluation Note  James Guzman , a 63 y.o. male  was evaluated in triage.  Pt complains of chest pain.  Review of Systems  Positive: Pleuritic chest pain, cough, shortness of breath  Negative: Recent travel, surgery, hemoptysis, fever   Physical Exam  BP (!) 163/72 (BP Location: Left Arm)   Pulse (!) 110   Temp 99.1 F (37.3 C) (Oral)   Resp (!) 24   SpO2 95%  Gen:   Awake, no distress   Resp:  Normal effort  MSK:   Moves extremities without difficulty  Other:  Lungs CTA heart tachycardic   Medical Decision Making  Medically screening exam initiated at 12:17 PM.  Appropriate orders placed.  Cherlyn Roberts was informed that the remainder of the evaluation will be completed by another provider, this initial triage assessment does not replace that evaluation, and the importance of remaining in the ED until their evaluation is complete.  Pt was apparently sent in for PE rule out by his PMD due to RBBB and tachycardia. No real risk factors but will check d-dimer, troponin   Lonell Grandchild, MD 03/18/23 1218

## 2023-03-18 NOTE — ED Notes (Signed)
Notified EDP Elpidio Anis of pt critical troponin 3562. Charge nurse notified and awaiting a room

## 2023-03-18 NOTE — ED Provider Notes (Addendum)
EMERGENCY DEPARTMENT AT [facility] Provider Note   CSN: [id number] Arrival date & time: 03/18/23  1151     History  Chief Complaint  Patient presents with   Chest Pain   Shortness of Breath    James Guzman is a 63 y.o. male.  Patient is a 63 year old male who presents with shortness of breath.  He has a history of hypertension, hyperlipidemia, diabetes, coronary artery disease with stent placement in 2019.  He said over the last couple weeks he has been having some shortness of breath.  Initially had a respiratory illness which had resolved.  He he does have some ongoing coughing which is productive of yellow sputum.  Over the last 2 days he has had some worsening shortness of breath with exertion with associated chest discomfort.  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  When he had to climb up the steps he would get short of breath and have some sharp pain in the center of his chest that would radiate up to his throat.  No associated back pain.  He does not currently have any chest discomfort.  No fevers.  No pleuritic pain.  No leg swelling.       Home Medications Prior to Admission medications   Medication Sig Start Date End Date Taking? Authorizing Provider  aspirin EC 81 MG tablet Take 81 mg by mouth daily.    [provider]  carvedilol (COREG) 3.125 MG tablet Take 1 tablet (3.125 mg total) by mouth 2 (two) times daily with a meal. 01/05/18   Lamar Blinks, MD  clopidogrel (PLAVIX) 75 MG tablet Take 1 tablet (75 mg total) by mouth daily with breakfast. 01/05/18   Lamar Blinks, MD  levothyroxine (SYNTHROID, LEVOTHROID) 175 MCG tablet Take 175 mcg by mouth daily before breakfast.    [provider]  levothyroxine (SYNTHROID, LEVOTHROID) 175 MCG tablet Take 1 tablet (175 mcg total) by mouth daily before breakfast. 01/06/18   Lamar Blinks, MD  losartan (COZAAR) 50 MG tablet Take 50  mg by mouth daily.    [provider]  losartan (COZAAR) 50 MG tablet Take 1 tablet (50 mg total) by mouth daily. 01/06/18   Lamar Blinks, MD  metFORMIN (GLUCOPHAGE-XR) 500 MG 24 hr tablet Take 500 mg by mouth daily with breakfast.    [provider]  rosuvastatin (CRESTOR) 10 MG tablet Take 1 tablet (10 mg total) by mouth daily at 6 PM. 01/05/18   Lamar Blinks, MD  vitamin B-12 (CYANOCOBALAMIN) 1000 MCG tablet Take 1,000 mcg by mouth every other day.    [provider]      Allergies    Patient has no known allergies.    Review of Systems   Review of Systems  Constitutional:  Positive for fatigue. Negative for chills, diaphoresis and fever.  HENT:  Negative for congestion, rhinorrhea and sneezing.   Eyes: Negative.   Respiratory:  Positive for cough and shortness of breath. Negative for chest tightness.   Cardiovascular:  Positive for chest pain. Negative for leg swelling.  Gastrointestinal:  Negative for abdominal pain, blood in stool, diarrhea, nausea and vomiting.  Genitourinary:  Negative for difficulty urinating, flank pain, frequency and hematuria.  Musculoskeletal:  Negative for arthralgias and back pain.  Skin:  Negative for rash.  Neurological:  Negative for dizziness, speech difficulty, weakness, numbness and headaches.    Physical Exam  Updated Vital Signs BP (!) 147/81   Pulse (!) 103   Temp 99.1 F (37.3 C) (Oral)   Resp (!) 29   Ht 5\' 6"  (1.676 m)   Wt 122.5 kg   SpO2 95%   BMI 43.58 kg/m  Physical Exam Constitutional:      Appearance: He is well-developed. He is obese.  HENT:     Head: Normocephalic and atraumatic.  Eyes:     Pupils: Pupils are equal, round, and reactive to light.  Cardiovascular:     Rate and Rhythm: Normal rate and regular rhythm.     Heart sounds: Normal heart sounds.  Pulmonary:     Effort: Pulmonary effort is normal. No respiratory distress.     Breath sounds: Normal breath sounds. No wheezing or  rales.  Chest:     Chest wall: No tenderness.  Abdominal:     General: Bowel sounds are normal.     Palpations: Abdomen is soft.     Tenderness: There is no abdominal tenderness. There is no guarding or rebound.  Musculoskeletal:        General: Normal range of motion.     Cervical back: Normal range of motion and neck supple.     Comments: No edema or calf tenderness  Lymphadenopathy:     Cervical: No cervical adenopathy.  Skin:    General: Skin is warm and dry.     Findings: No rash.  Neurological:     Mental Status: He is alert and oriented to person, place, and time.     ED Results / Procedures / Treatments   Labs (all labs ordered are listed, but only abnormal results are displayed) Labs Reviewed  BASIC METABOLIC PANEL - Abnormal; Notable for the following components:      Result Value   CO2 20 (*)    Glucose, Bld 222 (*)    Calcium 8.7 (*)    All other components within normal limits  BRAIN NATRIURETIC PEPTIDE - Abnormal; Notable for the following components:   B Natriuretic Peptide 280.3 (*)    All other components within normal limits  CBC - Abnormal; Notable for the following components:   WBC 17.8 (*)    All other components within normal limits  TROPONIN I (HIGH SENSITIVITY) - Abnormal; Notable for the following components:   Troponin I (High Sensitivity) 3,562 (*)    All other components within normal limits  RESP PANEL BY RT-PCR (RSV, FLU A&B, COVID)  RVPGX2  D-DIMER, QUANTITATIVE  TROPONIN I (HIGH SENSITIVITY)    EKG EKG Interpretation Date/Time:  Wednesday March 18 2023 13:32:21 EST Ventricular Rate:  102 PR Interval:  166 QRS Duration:  140 QT Interval:  350 QTC Calculation: 456 R Axis:   110  Text Interpretation: Sinus tachycardia RBBB and LPFB Confirmed by Rolan Bucco [phone]) on 03/18/2023 1:54:08 PM  Radiology DG Chest 2 View Result Date: 03/18/2023 CLINICAL DATA:  Chest pain, cough, and shortness of breath EXAM: CHEST - 2 VIEW  COMPARISON:  Chest radiograph dated 06/22/2009 FINDINGS: Normal lung volumes. Patchy right lower lobe and right middle lobe opacities. Blunting of the bilateral costophrenic angles. No pneumothorax. Similar mildly enlarged cardiomediastinal silhouette. No acute osseous abnormality. IMPRESSION: 1. Patchy right lower lobe and right middle lobe opacities, suspicious for pneumonia in the acute setting. 2. Blunting of the costophrenic angles, which may represent small pleural effusions. 3. Similar mild cardiomegaly. Electronically Signed   By: Agustin Cree M.D.   On: 03/18/2023 13:04  Procedures Procedures    Medications Ordered in ED Medications  aspirin tablet 325 mg (has no administration in time range)  lactated ringers bolus 1,000 mL (has no administration in time range)  cefTRIAXone (ROCEPHIN) 1 g in sodium chloride 0.9 % 100 mL IVPB (has no administration in time range)  azithromycin (ZITHROMAX) 500 mg in sodium chloride 0.9 % 250 mL IVPB (has no administration in time range)    ED Course/ Medical Decision Making/ A&P                                 Medical Decision Making Amount and/or Complexity of Data Reviewed Labs: ordered. Radiology: ordered.  Risk OTC drugs. Prescription drug management.   Patient is a 63 year old male who presents with exertional shortness of breath and chest discomfort.  He did have a respiratory illness about 3 weeks ago that seemed to resolve but he still has some ongoing coughing.  No recent fevers.  His EKG shows a right bundle branch block which is new from our prior EKG however on chart review looking at his cardiology notes, he was noted to have a right bundle branch block on his most recent EKG.  Chest x-ray was performed which was interpreted by me and confirmed by the radiologist to show evidence of a right side pneumonia/infiltrates.  His WBC count is elevated.  His troponin is elevated at 3500.  His other labs show an elevated glucose.  His D-dimer  is negative.  No other symptoms that would be more concerning for PE or dissection.  He was started on IV heparin.  He was given aspirin.  I did not give him nitro given that he is chest pain-free currently and his blood pressures been a little soft.  The last 1 that was checked was around 100 systolic.  He was given some gentle IV hydration.  I will also give him IV antibiotics for his pneumonia.  I spoke with cardiology (cards master) who will see the patient.  Cardiology request medicine admission.  I discussed the case with the hospitalist who will admit the pt.  CRITICAL CARE Performed by: Rolan Bucco Total critical care time: 60 minutes Critical care time was exclusive of separately billable procedures and treating other patients. Critical care was necessary to treat or prevent imminent or life-threatening deterioration. Critical care was time spent personally by me on the following activities: development of treatment plan with patient and/or surrogate as well as nursing, discussions with consultants, evaluation of patient's response to treatment, examination of patient, obtaining history from patient or surrogate, ordering and performing treatments and interventions, ordering and review of laboratory studies, ordering and review of radiographic studies, pulse oximetry and re-evaluation of patient's condition.   Final Clinical Impression(s) / ED Diagnoses Final diagnoses:  NSTEMI (non-ST elevated myocardial infarction) [facility])    Rx / DC Orders ED Discharge Orders     None         Rolan Bucco, MD 03/18/23 1420    Rolan Bucco, MD 03/18/23 1535

## 2023-03-18 NOTE — Consult Note (Addendum)
Cardiology Consultation   Patient ID: SENAI HAVNER MRN: [id number]; DOB: [birth year]  Admit date: 03/18/2023 Date of Consult: 03/18/2023  PCP:  Lonie Peak, PA-C   [facility] Providers Cardiologist:  None   {   Patient Profile:   HARJAS CARE is a 63 y.o. male with a hx of hypertension, hyperlipidemia, diabetes, CAD s/p PCI with stent to OM2 in 2019 who is being seen 03/18/2023 for the evaluation of chest pain, elevated troponins at the request of Dr. Fredderick Phenix.  History of Present Illness:   Mr. Taboada is a 63yo male with a past medical history of hypertension, hyperlipidemia, diabetes, CAD s/p PCI with stent to OM2 in 2019. He [facility] to [facility] ED with chest pain and shortness of breath. He reported a respiratory illness about three weeks prior that has left him with an ongoing cough which is productive of yellow sputum. Denies any recent fevers. He reports worsening shortness of breath and chest discomfort with exertion. 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   Results in the ED: CXR showed ,right side pneumonia/infiltrates, troponin elevated at 3,562 > 3,530, negative d-dimer, elevated WBC 17.8  Per chart review, patient follows with [facility] cardiology. His past cardiac medical history includes coronary artery disease s/p PCI and stent of OM1 in 2019, atherosclerotic PVD with intermittent claudication, hypertension, hyperlipidemia. He was seen 08/15/2022 with Fermin Schwab, PA. At this time he was having episodes of chest pain and shortness of breath with his last ischemic evaluation being 2019 they decided to proceed with a nuclear stress test. His stress test results were: normal left ventricular function, normal wall motion, no evidence for scar or ischemia. He was then seen post stress test on 10/02/2022 denying any symptoms in between his previous visit.   His PTA medication regimen included: ASA 81  mg daily, lisinopril 20 mg daily, amlodipine 5 mg daily for his CAD, HTN, PVD. In regards to his HLD there was reported intolerance to statins and Zetia. They discussed potentially starting PCSK9i for cholesterol control, patient deferred at that time, was to follow up with PCP.   In the ED patient has been given some IV hydration, ASA 325 mg, IV heparin, IV antibiotics for possible PNA.  Past Medical History:  Diagnosis Date   Diabetes mellitus (HCC) 2019   Hypertension 2019   Past Surgical History:  Procedure Laterality Date   CORONARY STENT INTERVENTION N/A 01/04/2018   Procedure: CORONARY STENT INTERVENTION;  Surgeon: Iran Ouch, MD;  Location: [facility] INVASIVE CV LAB;  Service: Cardiovascular;  Laterality: N/A;   LEFT HEART CATH AND CORONARY ANGIOGRAPHY Left 01/04/2018   Procedure: LEFT HEART CATH AND CORONARY ANGIOGRAPHY;  Surgeon: Lamar Blinks, MD;  Location: [facility] INVASIVE CV LAB;  Service: Cardiovascular;  Laterality: Left;   Pin Placement: Arm Left    63 y.o.     Home Medications:  Prior to Admission medications   Medication Sig Start Date End Date Taking? Authorizing Provider  aspirin EC 81 MG tablet Take 81 mg by mouth daily.    [provider]  carvedilol (COREG) 3.125 MG tablet Take 1 tablet (3.125 mg total) by mouth 2 (two) times daily with a meal. 01/05/18   Lamar Blinks, MD  clopidogrel (PLAVIX) 75 MG tablet Take 1 tablet (75 mg total) by mouth daily with breakfast. 01/05/18   Lamar Blinks, MD  levothyroxine (SYNTHROID, LEVOTHROID) 175 MCG tablet Take  175 mcg by mouth daily before breakfast.    [provider]  levothyroxine (SYNTHROID, LEVOTHROID) 175 MCG tablet Take 1 tablet (175 mcg total) by mouth daily before breakfast. 01/06/18   Lamar Blinks, MD  losartan (COZAAR) 50 MG tablet Take 50 mg by mouth daily.    [provider]  losartan (COZAAR) 50 MG tablet Take 1 tablet (50 mg total) by mouth daily. 01/06/18   Lamar Blinks, MD  metFORMIN (GLUCOPHAGE-XR) 500 MG 24 hr tablet Take 500 mg by mouth daily with breakfast.    [provider]  rosuvastatin (CRESTOR) 10 MG tablet Take 1 tablet (10 mg total) by mouth daily at 6 PM. 01/05/18   Lamar Blinks, MD  vitamin B-12 (CYANOCOBALAMIN) 1000 MCG tablet Take 1,000 mcg by mouth every other day.    [provider]   Inpatient Medications: Scheduled Meds:  aspirin  325 mg Oral Daily   Continuous Infusions:  azithromycin (ZITHROMAX) 500 mg in sodium chloride 0.9 % 250 mL IVPB 500 mg (03/18/23 1446)   heparin 1,250 Units/hr (03/18/23 1435)   PRN Meds:  Allergies:   No Known Allergies  Social History:   Social History   Socioeconomic History   Marital status: Single    Spouse name: Victorino Dike    Number of children: 1   Years of education: Not on file   Highest education level: Not on file  Occupational History   Occupation: Lobbyist     Comment: [employer]   Tobacco Use   Smoking status: Former    Current packs/day: 0.00    Types: Cigarettes    Quit date: 2011    Years since quitting: 14.0   Smokeless tobacco: Never  Vaping Use   Vaping status: Never Used  Substance and Sexual Activity   Alcohol use: Not Currently    Comment: quit about 15 years ago    Drug use: Not Currently    Types: Marijuana, Cocaine    Comment: quit drugs years ago    Sexual activity: Yes  Other Topics Concern   Not on file  Social History Narrative   Not on file   Social Drivers of Health   Financial Resource Strain: Low Risk  (01/04/2018)   Overall Financial Resource Strain (CARDIA)    Difficulty of Paying Living Expenses: Not very hard  Food Insecurity: No Food Insecurity (01/04/2018)   Hunger Vital Sign    Worried About Running Out of Food in the Last Year: Never true    Ran Out of Food in the Last Year: Never true  Transportation Needs: No Transportation Needs (01/04/2018)   PRAPARE - Scientist, research (physical sciences) (Medical): No    Lack of Transportation (Non-Medical): No  Physical Activity: Inactive (01/04/2018)   Exercise Vital Sign    Days of Exercise per Week: 0 days    Minutes of Exercise per Session: 0 min  Stress: No Stress Concern Present (01/04/2018)   [employer] of Occupational Health - Occupational Stress Questionnaire    Feeling of Stress : Only a little  Social Connections: Unknown (01/04/2018)   Social Connection and Isolation Panel [NHANES]    Frequency of Communication with Friends and Family: Three times a week    Frequency of Social Gatherings with Friends and Family: Once a week    Attends Religious Services: Never    Database administrator or Organizations: Not on file    Attends Banker  Meetings: Not on file    Marital Status: Married  Intimate Partner Violence: Not At Risk (01/04/2018)   Humiliation, Afraid, Rape, and Kick questionnaire    Fear of Current or Ex-Partner: No    Emotionally Abused: No    Physically Abused: No    Sexually Abused: No    Family History:   History reviewed. No pertinent family history.   ROS:  Please see the history of present illness.  All other ROS reviewed and negative.     Physical Exam/Data:   Vitals:   03/18/23 1153 03/18/23 1332 03/18/23 1338 03/18/23 1430  BP: (!) 163/72 (!) 147/81  (!) 148/81  Pulse: (!) 110 (!) 103  (!) 110  Resp: (!) 24 (!) 29  20  Temp: 99.1 F (37.3 C)     TempSrc: Oral     SpO2: 95% 95%  97%  Weight:   122.5 kg   Height:   5\' 6"  (1.676 m)     Intake/Output Summary (Last 24 hours) at 03/18/2023 1520 Last data filed at 03/18/2023 1435 Gross per 24 hour  Intake 40.17 ml  Output --  Net 40.17 ml      03/18/2023    1:38 PM 01/05/2018    5:05 AM 01/04/2018    5:05 PM  Last 3 Weights  Weight (lbs) 270 lb 238 lb 8.6 oz 242 lb 14.4 oz  Weight (kg) 122.471 kg 108.2 kg 110.179 kg     Body mass index is 43.58 kg/m.  General:  Well nourished, well developed, in no  acute distress, on RA HEENT: normal Neck: no JVD Vascular: Distal pulses 2+ bilaterally Cardiac:  normal S1, S2; RRR; no murmur  Lungs:  clear to auscultation bilaterally, no wheezing, rhonchi or rales  Abd: soft, nontender, distended  Ext: no edema Musculoskeletal:  No deformities Skin: warm and dry  Neuro:  No focal abnormalities noted Psych:  Normal affect   EKG:  The EKG was personally reviewed and demonstrates:  sinus tachycardia HR 102 with RBBB and LPFB Telemetry:  Telemetry was personally reviewed and demonstrates:  sinus tachycardia HR 106  Relevant CV Studies: Pending updated echocardiogram   Pharmacologic Myocardial Perfusion Imaging (08/29/22, [facility]) LVEF= 59% Regional wall motion:  reveals normal myocardial thickening and wall motion. The overall quality of the study is good.   Artifacts noted: no Left ventricular cavity: normal.  Perfusion Analysis:  SPECT images demonstrate homogeneous tracer distribution throughout the myocardium. Defect type : Normal    ECG Interpretation: Rest ECG:  normal sinus rhythm, right bundle branch block (RBBB) Stress ECG:  normal sinus rhythm, Recovery ECG:  normal sinus rhythm ECG Interpretation:  non-diagnostic due to pharmacologic testing.   1.  Normal left ventricular function 2.  Normal wall motion 3.  No evidence for scar or ischemia   Laboratory Data:  High Sensitivity Troponin:   Recent Labs  Lab 03/18/23 1153 03/18/23 1344  TROPONINIHS 3,562* 3,530*     Chemistry Recent Labs  Lab 03/18/23 1153  NA 135  K 3.7  CL 104  CO2 20*  GLUCOSE 222*  BUN 16  CREATININE 1.05  CALCIUM 8.7*  GFRNONAA >60  ANIONGAP 11    No results for input(s): "PROT", "ALBUMIN", "AST", "ALT", "ALKPHOS", "BILITOT" in the last 168 hours. Lipids No results for input(s): "CHOL", "TRIG", "HDL", "LABVLDL", "LDLCALC", "CHOLHDL" in the last 168 hours.  Hematology Recent Labs  Lab 03/18/23 1347  WBC 17.8*  RBC 4.34  HGB 14.3  HCT  41.7  MCV 96.1  MCH 32.9  MCHC 34.3  RDW 12.6  PLT 245   Thyroid No results for input(s): "TSH", "FREET4" in the last 168 hours.  BNP Recent Labs  Lab 03/18/23 1200  BNP 280.3*    DDimer  Recent Labs  Lab 03/18/23 1153  DDIMER 0.31   Radiology/Studies:  DG Chest 2 View Result Date: 03/18/2023 CLINICAL DATA:  Chest pain, cough, and shortness of breath EXAM: CHEST - 2 VIEW COMPARISON:  Chest radiograph dated 06/22/2009 FINDINGS: Normal lung volumes. Patchy right lower lobe and right middle lobe opacities. Blunting of the bilateral costophrenic angles. No pneumothorax. Similar mildly enlarged cardiomediastinal silhouette. No acute osseous abnormality. IMPRESSION: 1. Patchy right lower lobe and right middle lobe opacities, suspicious for pneumonia in the acute setting. 2. Blunting of the costophrenic angles, which may represent small pleural effusions. 3. Similar mild cardiomegaly. Electronically Signed   By: Agustin Cree M.D.   On: 03/18/2023 13:04   Assessment and Plan:   NSTEMI CAD s/p PCI with stent to OM2 2019 PCI from 12/2017 with stent to OM2 Has been following closely with [facility] cardiology  Patient presented with exertional chest pain and shortness of breath Currently without symptoms Elevated troponins 3,562 > 3,530 -- Patient will need LHC once treated for PNA and WBC normalizes  -- Continue ASA 81 mg daily and heparin drip -- Start low dose amlodipine   Hypertension PTA medications were lisinopril 20 mg daily, amlodipine 5 mg daily  Most recent BP 148/81 -- Start amlodipine 2.5 mg daily  -- Continue to monitor BP   Hyperlipidemia Ordered lipid panel Patient statin/Zetia intolerant Has previously refused PCSK9i  -- Will discuss options after lipid panel results   Per primary Pneumonia  Diabetes   Risk Assessment/Risk Scores:     TIMI Risk Score for Unstable Angina or Non-ST Elevation MI:   The patient's TIMI risk score is 4, which indicates a 20% risk of all  cause mortality, new or recurrent myocardial infarction or need for urgent revascularization in the next 14 days.    For questions or updates, please contact [facility] Please consult [email or web address] for contact info under    Signed, Olena Leatherwood, PA-C  03/18/2023 3:20 PM  Agree with note by Evlyn Clines, PA-C  We are asked to see this patient with complaints of shortness of breath and chest pain.  He has prior history of OM 2 PCI and stenting by Dr. Kirke Corin in 2019.  Other problems include hyperlipidemia, and essential hypertension.  His workup has been remarkable for chest x-ray consistent with pneumonia.  His white count is elevated and he is on antibiotics.  His troponins were elevated as well up to 3500.  His EKG shows right bundle branch block.  His exam is benign.  He is on IV heparin appropriately.  Will check a 2D echo and continue to treat his pneumonia.  Will tentatively plan left heart cath on Friday pending on clinical stability between now and then.  Runell Gess, M.D., FACP, [facility], [facility] [place] [address] [place], [place]  [address]  [phone] 03/18/2023 3:28 PM

## 2023-03-18 NOTE — Progress Notes (Signed)
PHARMACY - ANTICOAGULATION CONSULT NOTE  Pharmacy Consult for Heparin Indication:  ACS/STEMI  Allergies  Allergen Reactions   Codeine Other (See Comments)    pain   Statins Other (See Comments)    Muscle aches, Pt states it clenches up his muscles    Patient Measurements: Height: 5\' 6"  (167.6 cm) Weight: 122.5 kg (270 lb) IBW/kg (Calculated) : 63.8 Heparin Dosing Weight: 92.6kg  Vital Signs: Temp: 97.5 F (36.4 C) (01/22 2036) Temp Source: Oral (01/22 2036) BP: 151/83 (01/22 2036) Pulse Rate: 99 (01/22 2036)  Labs: Recent Labs    03/18/23 1153 03/18/23 1344 03/18/23 1347 03/18/23 2103  HGB  --   --  14.3  --   HCT  --   --  41.7  --   PLT  --   --  245  --   HEPARINUNFRC  --   --   --  0.22*  CREATININE 1.05  --   --   --   TROPONINIHS 3,562* 3,530*  --   --     Estimated Creatinine Clearance: 88.9 mL/min (by C-G formula based on SCr of 1.05 mg/dL).   Medical History: Past Medical History:  Diagnosis Date   Diabetes mellitus (HCC) 2019   Hypertension 2019   Medications:  Scheduled:   amLODipine  2.5 mg Oral Daily   aspirin  325 mg Oral Daily   insulin aspart  0-15 Units Subcutaneous TID WC   insulin aspart  0-5 Units Subcutaneous QHS   [START ON 03/19/2023] levothyroxine  175 mcg Oral Q0600   rosuvastatin  20 mg Oral q1800   sodium chloride flush  3 mL Intravenous Q12H   Infusions:   [START ON 03/19/2023] azithromycin (ZITHROMAX) 500 mg in sodium chloride 0.9 % 250 mL IVPB     [START ON 03/19/2023] cefTRIAXone (ROCEPHIN)  IV     heparin 1,250 Units/hr (03/18/23 1435)   Assessment: 62YOM who presents with intermittent chest pain, cough, and SOB for over 1 week. Not on [facility] per fill history and chart review. Pharmacy has been consulted to dose heparin.  Hgb 14.3, plt 245. Troph HS 3562  1/22 PM: initial HL 0.22 - subtherapeutic.  No issues per RN.    Goal of Therapy:  Heparin level 0.3-0.7 units/ml Monitor platelets by anticoagulation protocol:  Yes   Plan:  Increase heparin to 1400 units/hr 8h heparin level  Daily HL, CBC F/u cath plans (tentative Fri)  Trixie Rude, PharmD Clinical Pharmacist 03/18/2023  9:50 PM

## 2023-03-18 NOTE — Progress Notes (Signed)
PHARMACY - ANTICOAGULATION CONSULT NOTE  Pharmacy Consult for Heparin Indication:  ACS/STEMI  No Known Allergies  Patient Measurements: Height: 5\' 6"  (167.6 cm) Weight: 122.5 kg (270 lb) IBW/kg (Calculated) : 63.8 Heparin Dosing Weight: 92.6kg  Vital Signs: Temp: 99.1 F (37.3 C) (01/22 1153) Temp Source: Oral (01/22 1153) BP: 147/81 (01/22 1332) Pulse Rate: 103 (01/22 1332)  Labs: Recent Labs    03/18/23 1153 03/18/23 1347  HGB  --  14.3  HCT  --  41.7  PLT  --  245  CREATININE 1.05  --   TROPONINIHS 3,562*  --     Estimated Creatinine Clearance: 88.9 mL/min (by C-G formula based on SCr of 1.05 mg/dL).   Medical History: Past Medical History:  Diagnosis Date   Diabetes mellitus (HCC) 2019   Hypertension 2019   Medications:  Scheduled:   aspirin  325 mg Oral Daily   Infusions:   lactated ringers     Assessment: 62YOM who presents with intermittent chest pain, cough, and SOB for over 1 week. Not on [facility] per fill history and chart review. Pharmacy has been consulted to dose heparin.  Hgb 14.3, plt 245. Troph HS 3562  Goal of Therapy:  Heparin level 0.3-0.7 units/ml Monitor platelets by anticoagulation protocol: Yes   Plan:  Give 4000 units bolus x 1 Start heparin infusion at 1250 units/hr Check anti-Xa level in 6 hours and daily while on heparin Continue to monitor H&H and platelets  James Guzman 03/18/2023,2:10 PM

## 2023-03-18 NOTE — ED Triage Notes (Signed)
Pt arrives via EMS from PCPs office. Pt reports intermittent cp, cough, and sob for over a week. Pt was taking a steroid with no relief. PCP was concerned that the patient could have a PE and has a new RBBB. Pt is AxOx4. He currently denies chest pain. Pt took one 81mg  aspirin this morning and ems administered three 81mg  aspirin tablets.

## 2023-03-19 ENCOUNTER — Inpatient Hospital Stay (HOSPITAL_COMMUNITY): Payer: Managed Care, Other (non HMO)

## 2023-03-19 DIAGNOSIS — J189 Pneumonia, unspecified organism: Secondary | ICD-10-CM | POA: Diagnosis not present

## 2023-03-19 DIAGNOSIS — I2510 Atherosclerotic heart disease of native coronary artery without angina pectoris: Secondary | ICD-10-CM | POA: Diagnosis not present

## 2023-03-19 DIAGNOSIS — I214 Non-ST elevation (NSTEMI) myocardial infarction: Secondary | ICD-10-CM | POA: Diagnosis not present

## 2023-03-19 LAB — CBC
HCT: 41.5 % (ref 39.0–52.0)
Hemoglobin: 14 g/dL (ref 13.0–17.0)
MCH: 32.2 pg (ref 26.0–34.0)
MCHC: 33.7 g/dL (ref 30.0–36.0)
MCV: 95.4 fL (ref 80.0–100.0)
Platelets: 229 10*3/uL (ref 150–400)
RBC: 4.35 MIL/uL (ref 4.22–5.81)
RDW: 12.8 % (ref 11.5–15.5)
WBC: 13.7 10*3/uL — ABNORMAL HIGH (ref 4.0–10.5)
nRBC: 0 % (ref 0.0–0.2)

## 2023-03-19 LAB — GLUCOSE, CAPILLARY
Glucose-Capillary: 138 mg/dL — ABNORMAL HIGH (ref 70–99)
Glucose-Capillary: 116 mg/dL — ABNORMAL HIGH (ref 70–99)
Glucose-Capillary: 126 mg/dL — ABNORMAL HIGH (ref 70–99)
Glucose-Capillary: 152 mg/dL — ABNORMAL HIGH (ref 70–99)

## 2023-03-19 LAB — ECHOCARDIOGRAM COMPLETE
AR max vel: 2.47 cm2
AV Area VTI: 2.37 cm2
AV Area mean vel: 2.45 cm2
AV Mean grad: 5 mm[Hg]
AV Peak grad: 9 mm[Hg]
Ao pk vel: 1.5 m/s
Area-P 1/2: 5.13 cm2
Height: 66 in
S' Lateral: 3.05 cm
Weight: 4035.3 [oz_av]

## 2023-03-19 LAB — LIPID PANEL
Cholesterol: 177 mg/dL (ref 0–200)
HDL: 41 mg/dL (ref >40)
LDL Cholesterol: 109 mg/dL — ABNORMAL HIGH (ref 0–99)
Total CHOL/HDL Ratio: 4.3 {ratio}
Triglycerides: 135 mg/dL (ref <150)
VLDL: 27 mg/dL (ref 0–40)

## 2023-03-19 LAB — HEPARIN LEVEL (UNFRACTIONATED)
Heparin Unfractionated: 0.17 [IU]/mL — ABNORMAL LOW (ref 0.30–0.70)
Heparin Unfractionated: 0.32 [IU]/mL (ref 0.30–0.70)
Heparin Unfractionated: 0.3 [IU]/mL (ref 0.30–0.70)

## 2023-03-19 LAB — COMPREHENSIVE METABOLIC PANEL
ALT: 46 U/L — ABNORMAL HIGH (ref 0–44)
AST: 39 U/L (ref 15–41)
Albumin: 3.3 g/dL — ABNORMAL LOW (ref 3.5–5.0)
Alkaline Phosphatase: 78 U/L (ref 38–126)
Anion gap: 10 (ref 5–15)
BUN: 14 mg/dL (ref 8–23)
CO2: 23 mmol/L (ref 22–32)
Calcium: 8.4 mg/dL — ABNORMAL LOW (ref 8.9–10.3)
Chloride: 99 mmol/L (ref 98–111)
Creatinine, Ser: 0.9 mg/dL (ref 0.61–1.24)
GFR, Estimated: >60 mL/min (ref >60)
Glucose, Bld: 142 mg/dL — ABNORMAL HIGH (ref 70–99)
Potassium: 3.3 mmol/L — ABNORMAL LOW (ref 3.5–5.1)
Sodium: 132 mmol/L — ABNORMAL LOW (ref 135–145)
Total Bilirubin: 1 mg/dL (ref 0.0–1.2)
Total Protein: 6.8 g/dL (ref 6.5–8.1)

## 2023-03-19 MED ORDER — ASPIRIN 81 MG PO CHEW
81.0000 mg | CHEWABLE_TABLET | ORAL | Status: AC
Start: 2023-03-20 — End: 2023-03-20
  Administered 2023-03-20: 81 mg via ORAL

## 2023-03-19 MED ORDER — GUAIFENESIN ER 600 MG PO TB12
1200.0000 mg | ORAL_TABLET | Freq: Two times a day (BID) | ORAL | Status: DC
  Administered 2023-03-19 – 2023-03-21 (×4): 1200 mg via ORAL
  Filled 2023-03-19 (×4): qty 2

## 2023-03-19 MED ORDER — METOPROLOL TARTRATE 12.5 MG HALF TABLET
12.5000 mg | ORAL_TABLET | Freq: Two times a day (BID) | ORAL | Status: DC
Start: 2023-03-19 — End: 2023-03-21
  Administered 2023-03-19 – 2023-03-21 (×5): 12.5 mg via ORAL
  Filled 2023-03-19 (×5): qty 1

## 2023-03-19 MED ORDER — IPRATROPIUM BROMIDE 0.02 % IN SOLN
0.5000 mg | Freq: Four times a day (QID) | RESPIRATORY_TRACT | Status: DC
Start: 2023-03-19 — End: 2023-03-20
  Administered 2023-03-19 – 2023-03-20 (×3): 0.5 mg via RESPIRATORY_TRACT
  Filled 2023-03-19 (×3): qty 2.5

## 2023-03-19 MED ORDER — HEPARIN BOLUS VIA INFUSION
2000.0000 [IU] | Freq: Once | INTRAVENOUS | Status: AC
  Administered 2023-03-19: 2000 [IU] via INTRAVENOUS
  Filled 2023-03-19: qty 2000

## 2023-03-19 MED ORDER — HEPARIN (PORCINE) 25000 UT/250ML-% IV SOLN
1750.0000 [IU]/h | INTRAVENOUS | Status: DC
  Administered 2023-03-19: 1700 [IU]/h via INTRAVENOUS
  Filled 2023-03-19 (×2): qty 250

## 2023-03-19 MED ORDER — LEVALBUTEROL HCL 0.63 MG/3ML IN NEBU
0.6300 mg | INHALATION_SOLUTION | Freq: Four times a day (QID) | RESPIRATORY_TRACT | Status: DC
Start: 2023-03-19 — End: 2023-03-20
  Administered 2023-03-19 – 2023-03-20 (×3): 0.63 mg via RESPIRATORY_TRACT
  Filled 2023-03-19 (×3): qty 3

## 2023-03-19 MED ORDER — ASPIRIN 81 MG PO TBEC
81.0000 mg | DELAYED_RELEASE_TABLET | Freq: Every day | ORAL | Status: DC
  Administered 2023-03-19 – 2023-03-21 (×2): 81 mg via ORAL
  Filled 2023-03-19 (×3): qty 1

## 2023-03-19 MED ORDER — SODIUM CHLORIDE 0.9 % WEIGHT BASED INFUSION
3.0000 mL/kg/h | INTRAVENOUS | Status: AC
  Administered 2023-03-20: 3 mL/kg/h via INTRAVENOUS

## 2023-03-19 MED ORDER — POTASSIUM CHLORIDE CRYS ER 20 MEQ PO TBCR
40.0000 meq | EXTENDED_RELEASE_TABLET | Freq: Two times a day (BID) | ORAL | Status: AC
  Administered 2023-03-19 (×2): 40 meq via ORAL
  Filled 2023-03-19 (×2): qty 2

## 2023-03-19 MED ORDER — SODIUM CHLORIDE 0.9 % WEIGHT BASED INFUSION
1.0000 mL/kg/h | INTRAVENOUS | Status: DC
  Administered 2023-03-20 (×2): 1 mL/kg/h via INTRAVENOUS

## 2023-03-19 NOTE — Plan of Care (Signed)
  Problem: Coping: Goal: Ability to adjust to condition or change in health will improve Outcome: Progressing   Problem: Fluid Volume: Goal: Ability to maintain a balanced intake and output will improve Outcome: Progressing   Problem: Health Behavior/Discharge Planning: Goal: Ability to identify and utilize available resources and services will improve Outcome: Progressing Goal: Ability to manage health-related needs will improve Outcome: Progressing   Problem: Metabolic: Goal: Ability to maintain appropriate glucose levels will improve Outcome: Progressing   Problem: Nutritional: Goal: Maintenance of adequate nutrition will improve Outcome: Progressing   Problem: Skin Integrity: Goal: Risk for impaired skin integrity will decrease Outcome: Progressing   Problem: Clinical Measurements: Goal: Ability to maintain clinical measurements within normal limits will improve Outcome: Progressing Goal: Respiratory complications will improve Outcome: Progressing Goal: Cardiovascular complication will be avoided Outcome: Progressing   Problem: Activity: Goal: Risk for activity intolerance will decrease Outcome: Progressing   Problem: Pain Managment: Goal: General experience of comfort will improve and/or be controlled Outcome: Progressing   Problem: Skin Integrity: Goal: Risk for impaired skin integrity will decrease Outcome: Progressing

## 2023-03-19 NOTE — Progress Notes (Signed)
PROGRESS NOTE    James Guzman  ZOX:[id number] DOB: June 24, 1960 DOA: 03/18/2023 PCP: Lonie Peak, PA-C   Brief Narrative:  The patient is an 63 year old morbidly obese Caucasian male with a past medical history significant for but not limited to dyslipidemia, diabetes mellitus type 2 on oral agents, hypothyroidism, PVD and CAD status post stent to the 19 who presented to the ED from his PCP office after reporting a 3-week history of upper respiratory symptoms that worsened over the last several days.  Symptoms include pleuritic chest pain with coughing shortness of breath on exertion as well as orthopnea.  PCP noted a new right bundle branch block and is concerned over a PE therefore he sent to the ED for further evaluation.  Upon arrival to the ED his O2 saturations were around 95% his D-dimer was normal.  Chest x-ray did demonstrate right-sided opacities and given his cardiac history and reports of chest pain a troponin was checked and was elevated of 3562 and repeat was 3530.  BNP was slightly elevated but were not consistent with volume overload.  Cardiology was consulted and he was initiated on IV antibiotics.  Given his symptoms cardiology recommending a left heart cath and this will be done on Friday, 03/20/2023 after further treatment of his pneumonia and stabilization.  Assessment and Plan:  Non-STEMI/history of CAD and prior coronary stent to OM 2 in 2019 -Patient was sent to the ED by his PCP because of shortness of breath and chest pain in context of new right bundle branch block -Was found to have elevated troponin x 2 greater than 3000; Continue to trend -Cardiology has consulted and plan is to pursue left heart cath this admission and plan is for 03/20/23 -Continue IV heparin and full dose aspirin; preadmission Plavix on hold -Patient reports last stress test several months ago.  He reports his initial cardiac ischemic equivalent was shortness of breath and dyspnea on exertion -He  reports had not been having the symptoms prior to onset of his URI -Echocardiogram done and showed "Left ventricular ejection fraction, by estimation, is 55 to 60%. The left ventricle has normal function. The left ventricle has no regional wall motion abnormalities. Left ventricular diastolic parameters are indeterminate. Right ventricular systolic function is normal." -Lipid Panel as below -Further care per Cardiology initiating his beta-blocker pending his echo with metoprolol tartrate 12.5 mg p.o. twice daily   Right-sided community-acquired pneumonia/question history of COPD -Currently stable on room air but ambulatory O2 sats have not been obtained -Continue IV Rocephin and Zithromax; patient had been taking doxycycline prior to admission starting on 03/16/2023 as well as prednisone Dosepak -Obtain CRP (3.9), procalcitonin, urinary strep antigen and if possible expectorated sputum culture -Incentive spirometry and early mobilization -O2 if needed for sats less than or equal to 94% -Will place on Xopenex/Atrovent, 1200 mg po Guaifenesin, Flutter Valve and Incentive Spirometry -Initial CXR done and showed "Patchy right lower lobe and right middle lobe opacities, suspicious for pneumonia in the acute setting. Blunting of the costophrenic angles, which may represent small pleural effusions. Similar mild cardiomegaly." -Repeat CXR pending    Essential Hypertension -Cardiology has continued Amlodipine 2.5 mg po Daily but at a lower dose -C/w IV Hydralazine 10 mg q6hprn SBP >180 -Continue to Monitor BP per Protocol -Last BP reading was 111/81   Diabetes Mellitus 2 with Hyperglycemia -Glucose greater than 200 at presentation and likely due to acute illness -Checked Hemoglobin A1c and was 6.3 -Follow CBGs and continue Moderate SSI AC/HS -  Hold home Metformin especially in context of upcoming left heart cath -Hold preadmission Glipizide -CBG Trend:  Recent Labs  Lab 03/18/23 1808  03/18/23 2131 03/19/23 0634 03/19/23 1221  GLUCAP 150* 135* 138* 116*    Dyslipidemia -Documented that he is statin and Zetia intolerant -Lipid panel and showed a total cholesterol/HDL ratio 4.3, cholesterol level 127, HDL 41, LDL of 109, triglycerides of 135, VLDL 27 -Cardiology has now started him on Rosuvastatin 20 mg po daily    Hypothyroidism -Continue Levothyroxine 175 mcg po Daily  -Check TSH in the AM   PVD -History of Claudication -Continue efforts to control diabetes and lipid status -Former smoker and will need continued tobacco cessation counseling   Hypokalemia -Patient's K+ Level Trend: Recent Labs  Lab 03/18/23 1153 03/19/23 0526  K 3.7 3.3*  -Replete with po KCL 40 mEQ BID -Continue to Monitor and Replete as Necessary -Repeat CMP in the AM   Hypoalbuminemia -Patient's Albumin Trend: Recent Labs  Lab 03/19/23 0526  ALBUMIN 3.3*  -Continue to Monitor and Trend and repeat CMP in the AM  Class III Obesity (Morbid) -Complicates overall prognosis and care -Estimated body mass index is 40.71 kg/m as calculated from the following:   Height as of this encounter: 5\' 6"  (1.676 m).   Weight as of this encounter: 114.4 kg.  -Weight Loss and Dietary Counseling given   DVT prophylaxis: Anticoagulated with a heparin drip    Code Status: Full Code Family Communication: Discussed with family currently at bedside  Disposition Plan:  Level of care: Progressive Status is: Inpatient Remains inpatient appropriate because: His further clinical improvement and clearance by cardiology with further workup   Consultants:  Cardiology  Procedures:  ECHOCARDIOGRAM IMPRESSIONS     1. Left ventricular ejection fraction, by estimation, is 55 to 60%. The  left ventricle has normal function. The left ventricle has no regional  wall motion abnormalities. Left ventricular diastolic parameters are  indeterminate.   2. Right ventricular systolic function is normal. The  right ventricular  size is normal. Tricuspid regurgitation signal is inadequate for assessing  PA pressure.   3. The mitral valve is normal in structure. Mild mitral valve  regurgitation. No evidence of mitral stenosis.   4. The aortic valve is normal in structure. Aortic valve regurgitation is  not visualized. Aortic valve sclerosis/calcification is present, without  any evidence of aortic stenosis.   5. The inferior vena cava is normal in size with greater than 50%  respiratory variability, suggesting right atrial pressure of 3 mmHg.   FINDINGS   Left Ventricle: Left ventricular ejection fraction, by estimation, is 55  to 60%. The left ventricle has normal function. The left ventricle has no  regional wall motion abnormalities. The left ventricular internal cavity  size was normal in size. There is   no left ventricular hypertrophy. Left ventricular diastolic parameters  are indeterminate.   Right Ventricle: The right ventricular size is normal. No increase in  right ventricular wall thickness. Right ventricular systolic function is  normal. Tricuspid regurgitation signal is inadequate for assessing PA  pressure.   Left Atrium: Left atrial size was normal in size.   Right Atrium: Right atrial size was normal in size.   Pericardium: Trivial pericardial effusion is present. The pericardial  effusion is circumferential.   Mitral Valve: The mitral valve is normal in structure. Mild mitral valve  regurgitation. No evidence of mitral valve stenosis.   Tricuspid Valve: The tricuspid valve is normal in structure. Tricuspid  valve regurgitation is not demonstrated. No evidence of tricuspid  stenosis.   Aortic Valve: The aortic valve is normal in structure. Aortic valve  regurgitation is not visualized. Aortic valve sclerosis/calcification is  present, without any evidence of aortic stenosis. Aortic valve mean  gradient measures 5.0 mmHg. Aortic valve peak   gradient measures 9.0  mmHg. Aortic valve area, by VTI measures 2.37 cm.   Pulmonic Valve: The pulmonic valve was normal in structure. Pulmonic valve  regurgitation is not visualized. No evidence of pulmonic stenosis.   Aorta: The aortic root is normal in size and structure.   Venous: The inferior vena cava is normal in size with greater than 50%  respiratory variability, suggesting right atrial pressure of 3 mmHg.   IAS/Shunts: No atrial level shunt detected by color flow Doppler.     LEFT VENTRICLE  PLAX 2D  LVIDd:         4.50 cm   Diastology  LVIDs:         3.05 cm   LV e' medial:    7.94 cm/s  LV PW:         1.10 cm   LV E/e' medial:  12.7  LV IVS:        0.95 cm   LV e' lateral:   9.46 cm/s  LVOT diam:     2.10 cm   LV E/e' lateral: 10.7  LV SV:         64  LV SV Index:   29  LVOT Area:     3.46 cm     RIGHT VENTRICLE             IVC  RV S prime:     12.00 cm/s  IVC diam: 1.80 cm  TAPSE (M-mode): 2.2 cm   LEFT ATRIUM             Index        RIGHT ATRIUM           Index  LA diam:        4.30 cm 1.95 cm/m   RA Area:     13.60 cm  LA Vol (A2C):   62.1 ml 28.14 ml/m  RA Volume:   35.00 ml  15.86 ml/m  LA Vol (A4C):   42.4 ml 19.21 ml/m  LA Biplane Vol: 51.4 ml 23.29 ml/m   AORTIC VALVE                    PULMONIC VALVE  AV Area (Vmax):    2.47 cm     PV Vmax:       0.71 m/s  AV Area (Vmean):   2.45 cm     PV Peak grad:  2.0 mmHg  AV Area (VTI):     2.37 cm  AV Vmax:           150.00 cm/s  AV Vmean:          98.750 cm/s  AV VTI:            0.272 m  AV Peak Grad:      9.0 mmHg  AV Mean Grad:      5.0 mmHg  LVOT Vmax:         107.00 cm/s  LVOT Vmean:        69.900 cm/s  LVOT VTI:          0.186 m  LVOT/AV VTI ratio: 0.68    AORTA  Ao  Root diam: 3.00 cm  Ao Asc diam:  3.30 cm   MITRAL VALVE  MV Area (PHT): 5.13 cm     SHUNTS  MV Decel Time: 148 msec     Systemic VTI:  0.19 m  MV E velocity: 101.00 cm/s  Systemic Diam: 2.10 cm  MV A velocity: 102.00 cm/s  MV E/A ratio:   0.99   Antimicrobials:  Anti-infectives (From admission, onward)    Start     Dose/Rate Route Frequency Ordered Stop   03/19/23 1000  azithromycin (ZITHROMAX) 500 mg in sodium chloride 0.9 % 250 mL IVPB        500 mg 250 mL/hr over 60 Minutes Intravenous Every 24 hours 03/18/23 1536     03/19/23 1000  cefTRIAXone (ROCEPHIN) 1 g in sodium chloride 0.9 % 100 mL IVPB        1 g 200 mL/hr over 30 Minutes Intravenous Every 24 hours 03/18/23 1536     03/18/23 1415  cefTRIAXone (ROCEPHIN) 1 g in sodium chloride 0.9 % 100 mL IVPB        1 g 200 mL/hr over 30 Minutes Intravenous  Once 03/18/23 1413 03/18/23 1859   03/18/23 1415  azithromycin (ZITHROMAX) 500 mg in sodium chloride 0.9 % 250 mL IVPB        500 mg 250 mL/hr over 60 Minutes Intravenous  Once 03/18/23 1413 03/18/23 1859       Subjective: In and examined at bedside and he was doing okay today.  Denying chest pain.  No nausea or vomiting.  No lightheadedness or dizziness.  Understands that he will be going for cardiac cath tomorrow.  Objective: Vitals:   03/19/23 0353 03/19/23 0500 03/19/23 0723 03/19/23 1200  BP: 128/81  138/89 111/81  Pulse: 97  (!) 102   Resp: 20  20 20   Temp: 97.9 F (36.6 C)  98 F (36.7 C) 98.3 F (36.8 C)  TempSrc: Oral  Oral Oral  SpO2: 95%  96% 94%  Weight:  114.4 kg    Height:        Intake/Output Summary (Last 24 hours) at 03/19/2023 1418 Last data filed at 03/19/2023 0645 Gross per 24 hour  Intake 605.04 ml  Output 900 ml  Net -294.96 ml   Filed Weights   03/18/23 1338 03/19/23 0500  Weight: 122.5 kg 114.4 kg   Examination: Physical Exam:  Constitutional: WN/WD morbidly obese Caucasian male no acute distress Respiratory: Diminished to auscultation bilaterally with some coarse breath sounds and has some rhonchi and some slight crackles.  No appreciable wheezing or rales., Normal respiratory effort and patient is not tachypenic. No accessory muscle use.  Not wearing supplemental oxygen  nasal cannula Cardiovascular: RRR, no murmurs / rubs / gallops. S1 and S2 auscultated.  Trace extremity edema.  Abdomen: Soft, non-tender, distended secondary to body habitus. Bowel sounds positive.  GU: Deferred. Musculoskeletal: No clubbing / cyanosis of digits/nails. No joint deformity in the upper and lower extremities. Skin: No rashes, lesions, ulcers limited skin evaluation but does have a tattoo on his right arm. No induration; Warm and dry.  Neurologic: CN 2-12 grossly intact with no focal deficits. Romberg sign and cerebellar reflexes not assessed.  Psychiatric: Normal judgment and insight. Alert and oriented x 3.  Data Reviewed: I have personally reviewed following labs and imaging studies  CBC: Recent Labs  Lab 03/18/23 1347 03/19/23 0526  WBC 17.8* 13.7*  HGB 14.3 14.0  HCT 41.7 41.5  MCV 96.1 95.4  PLT  245 229   Basic Metabolic Panel: Recent Labs  Lab 03/18/23 1153 03/19/23 0526  NA 135 132*  K 3.7 3.3*  CL 104 99  CO2 20* 23  GLUCOSE 222* 142*  BUN 16 14  CREATININE 1.05 0.90  CALCIUM 8.7* 8.4*   GFR: Estimated Creatinine Clearance: 99.8 mL/min (by C-G formula based on SCr of 0.9 mg/dL). Liver Function Tests: Recent Labs  Lab 03/19/23 0526  AST 39  ALT 46*  ALKPHOS 78  BILITOT 1.0  PROT 6.8  ALBUMIN 3.3*   No results for input(s): "LIPASE", "AMYLASE" in the last 168 hours. No results for input(s): "AMMONIA" in the last 168 hours. Coagulation Profile: No results for input(s): "INR", "PROTIME" in the last 168 hours. Cardiac Enzymes: No results for input(s): "CKTOTAL", "CKMB", "CKMBINDEX", "TROPONINI" in the last 168 hours. BNP (last 3 results) No results for input(s): "PROBNP" in the last 8760 hours. HbA1C: Recent Labs    03/18/23 1826  HGBA1C 6.3*   CBG: Recent Labs  Lab 03/18/23 1808 03/18/23 2131 03/19/23 0634 03/19/23 1221  GLUCAP 150* 135* 138* 116*   Lipid Profile: Recent Labs    03/19/23 0526  CHOL 177  HDL 41  LDLCALC  109*  TRIG 135  CHOLHDL 4.3   Thyroid Function Tests: No results for input(s): "TSH", "T4TOTAL", "FREET4", "T3FREE", "THYROIDAB" in the last 72 hours. Anemia Panel: No results for input(s): "VITAMINB12", "FOLATE", "FERRITIN", "TIBC", "IRON", "RETICCTPCT" in the last 72 hours. Sepsis Labs: Recent Labs  Lab 03/18/23 1826  PROCALCITON <0.10    Recent Results (from the past 240 hours)  Resp panel by RT-PCR (RSV, Flu A&B, Covid) Anterior Nasal Swab     Status: None   Collection Time: 03/18/23  1:44 PM   Specimen: Anterior Nasal Swab  Result Value Ref Range Status   SARS Coronavirus 2 by RT PCR NEGATIVE NEGATIVE Final   Influenza A by PCR NEGATIVE NEGATIVE Final   Influenza B by PCR NEGATIVE NEGATIVE Final    Comment: (NOTE) The Xpert Xpress SARS-CoV-2/FLU/RSV plus assay is intended as an aid in the diagnosis of influenza from Nasopharyngeal swab specimens and should not be used as a sole basis for treatment. Nasal washings and aspirates are unacceptable for Xpert Xpress SARS-CoV-2/FLU/RSV testing.  Fact Sheet for Patients: BloggerCourse.com  Fact Sheet for Healthcare Providers: SeriousBroker.it  This test is not yet approved or cleared by the [place] FDA and has been authorized for detection and/or diagnosis of SARS-CoV-2 by FDA under an Emergency Use Authorization (EUA). This EUA will remain in effect (meaning this test can be used) for the duration of the COVID-19 declaration under Section 564(b)(1) of the Act, 21 U.S.C. section 360bbb-3(b)(1), unless the authorization is terminated or revoked.     Resp Syncytial Virus by PCR NEGATIVE NEGATIVE Final    Comment: (NOTE) Fact Sheet for Patients: BloggerCourse.com  Fact Sheet for Healthcare Providers: SeriousBroker.it  This test is not yet approved or cleared by the [place] FDA and has been authorized for  detection and/or diagnosis of SARS-CoV-2 by FDA under an Emergency Use Authorization (EUA). This EUA will remain in effect (meaning this test can be used) for the duration of the COVID-19 declaration under Section 564(b)(1) of the Act, 21 U.S.C. section 360bbb-3(b)(1), unless the authorization is terminated or revoked.  Performed at [facility], [address], [place], [place] [address]      Radiology Studies: ECHOCARDIOGRAM COMPLETE Result Date: 03/19/2023    ECHOCARDIOGRAM REPORT   Patient Name:  Cherlyn Roberts Date of Exam: 03/19/2023 Medical Rec #:  [id number]         Height:       66.0 in Accession #:    [id number]        Weight:       252.2 lb Date of Birth:  [birth year]         BSA:          2.207 m Patient Age:    62 years          BP:           138/89 mmHg Patient Gender: M                 HR:           97 bpm. Exam Location:  Inpatient Procedure: 2D Echo, Color Doppler and Cardiac Doppler Indications:    CAD  History:        Patient has no prior history of Echocardiogram examinations.                 CAD; Risk Factors:Hypertension, Diabetes, Dyslipidemia and                 Former Smoker.  Sonographer:    Dondra Prader RVT RCS Referring Phys: Evlyn Clines, A  Sonographer Comments: Technically challenging study due to limited acoustic windows, Technically difficult study due to poor echo windows, suboptimal apical window, suboptimal parasternal window and suboptimal subcostal window. Image acquisition challenging due to patient body habitus. IMPRESSIONS  1. Left ventricular ejection fraction, by estimation, is 55 to 60%. The left ventricle has normal function. The left ventricle has no regional wall motion abnormalities. Left ventricular diastolic parameters are indeterminate.  2. Right ventricular systolic function is normal. The right ventricular size is normal. Tricuspid regurgitation signal is inadequate for assessing PA pressure.  3. The mitral valve is normal in structure.  Mild mitral valve regurgitation. No evidence of mitral stenosis.  4. The aortic valve is normal in structure. Aortic valve regurgitation is not visualized. Aortic valve sclerosis/calcification is present, without any evidence of aortic stenosis.  5. The inferior vena cava is normal in size with greater than 50% respiratory variability, suggesting right atrial pressure of 3 mmHg. FINDINGS  Left Ventricle: Left ventricular ejection fraction, by estimation, is 55 to 60%. The left ventricle has normal function. The left ventricle has no regional wall motion abnormalities. The left ventricular internal cavity size was normal in size. There is  no left ventricular hypertrophy. Left ventricular diastolic parameters are indeterminate. Right Ventricle: The right ventricular size is normal. No increase in right ventricular wall thickness. Right ventricular systolic function is normal. Tricuspid regurgitation signal is inadequate for assessing PA pressure. Left Atrium: Left atrial size was normal in size. Right Atrium: Right atrial size was normal in size. Pericardium: Trivial pericardial effusion is present. The pericardial effusion is circumferential. Mitral Valve: The mitral valve is normal in structure. Mild mitral valve regurgitation. No evidence of mitral valve stenosis. Tricuspid Valve: The tricuspid valve is normal in structure. Tricuspid valve regurgitation is not demonstrated. No evidence of tricuspid stenosis. Aortic Valve: The aortic valve is normal in structure. Aortic valve regurgitation is not visualized. Aortic valve sclerosis/calcification is present, without any evidence of aortic stenosis. Aortic valve mean gradient measures 5.0 mmHg. Aortic valve peak  gradient measures 9.0 mmHg. Aortic valve area, by VTI measures 2.37 cm. Pulmonic Valve: The pulmonic valve was normal in structure. Pulmonic valve regurgitation is not  visualized. No evidence of pulmonic stenosis. Aorta: The aortic root is normal in size and  structure. Venous: The inferior vena cava is normal in size with greater than 50% respiratory variability, suggesting right atrial pressure of 3 mmHg. IAS/Shunts: No atrial level shunt detected by color flow Doppler.  LEFT VENTRICLE PLAX 2D LVIDd:         4.50 cm   Diastology LVIDs:         3.05 cm   LV e' medial:    7.94 cm/s LV PW:         1.10 cm   LV E/e' medial:  12.7 LV IVS:        0.95 cm   LV e' lateral:   9.46 cm/s LVOT diam:     2.10 cm   LV E/e' lateral: 10.7 LV SV:         64 LV SV Index:   29 LVOT Area:     3.46 cm  RIGHT VENTRICLE             IVC RV S prime:     12.00 cm/s  IVC diam: 1.80 cm TAPSE (M-mode): 2.2 cm LEFT ATRIUM             Index        RIGHT ATRIUM           Index LA diam:        4.30 cm 1.95 cm/m   RA Area:     13.60 cm LA Vol (A2C):   62.1 ml 28.14 ml/m  RA Volume:   35.00 ml  15.86 ml/m LA Vol (A4C):   42.4 ml 19.21 ml/m LA Biplane Vol: 51.4 ml 23.29 ml/m  AORTIC VALVE                    PULMONIC VALVE AV Area (Vmax):    2.47 cm     PV Vmax:       0.71 m/s AV Area (Vmean):   2.45 cm     PV Peak grad:  2.0 mmHg AV Area (VTI):     2.37 cm AV Vmax:           150.00 cm/s AV Vmean:          98.750 cm/s AV VTI:            0.272 m AV Peak Grad:      9.0 mmHg AV Mean Grad:      5.0 mmHg LVOT Vmax:         107.00 cm/s LVOT Vmean:        69.900 cm/s LVOT VTI:          0.186 m LVOT/AV VTI ratio: 0.68  AORTA Ao Root diam: 3.00 cm Ao Asc diam:  3.30 cm MITRAL VALVE MV Area (PHT): 5.13 cm     SHUNTS MV Decel Time: 148 msec     Systemic VTI:  0.19 m MV E velocity: 101.00 cm/s  Systemic Diam: 2.10 cm MV A velocity: 102.00 cm/s MV E/A ratio:  0.99 Kardie Tobb DO Electronically signed by Thomasene Ripple DO Signature Date/Time: 03/19/2023/12:57:03 PM    Final    DG Chest 2 View Result Date: 03/18/2023 CLINICAL DATA:  Chest pain, cough, and shortness of breath EXAM: CHEST - 2 VIEW COMPARISON:  Chest radiograph dated 06/22/2009 FINDINGS: Normal lung volumes. Patchy right lower lobe and right  middle lobe opacities. Blunting of the bilateral costophrenic angles. No pneumothorax. Similar mildly enlarged  cardiomediastinal silhouette. No acute osseous abnormality. IMPRESSION: 1. Patchy right lower lobe and right middle lobe opacities, suspicious for pneumonia in the acute setting. 2. Blunting of the costophrenic angles, which may represent small pleural effusions. 3. Similar mild cardiomegaly. Electronically Signed   By: Agustin Cree M.D.   On: 03/18/2023 13:04   Scheduled Meds:  amLODipine  2.5 mg Oral Daily   aspirin EC  81 mg Oral Daily   guaiFENesin  1,200 mg Oral BID   insulin aspart  0-15 Units Subcutaneous TID WC   insulin aspart  0-5 Units Subcutaneous QHS   ipratropium  0.5 mg Nebulization Q6H   levalbuterol  0.63 mg Nebulization Q6H   levothyroxine  175 mcg Oral Q0600   metoprolol tartrate  12.5 mg Oral BID   potassium chloride  40 mEq Oral BID   rosuvastatin  20 mg Oral q1800   sodium chloride flush  3 mL Intravenous Q12H   Continuous Infusions:  azithromycin (ZITHROMAX) 500 mg in sodium chloride 0.9 % 250 mL IVPB 500 mg (03/19/23 1103)   cefTRIAXone (ROCEPHIN)  IV 1 g (03/19/23 1205)   heparin 1,700 Units/hr (03/19/23 0857)    LOS: 1 day   Marguerita Merles, DO [facility] Available via Epic secure chat 7am-7pm After these hours, please refer to coverage provider listed on [email or web address] 03/19/2023, 2:18 PM

## 2023-03-19 NOTE — Progress Notes (Signed)
*  PRELIMINARY RESULTS* Echocardiogram 2D Echocardiogram has been performed.  James Guzman 03/19/2023, 11:49 AM

## 2023-03-19 NOTE — Progress Notes (Addendum)
Patient Name: James Guzman Date of Encounter: 03/19/2023 [facility] Cardiologist: Nanetta Batty, MD   Interval Summary  .    Patient resting comfortably in bed this morning. Reports breathing is "about halfway back to normal."   Vital Signs .    Vitals:   03/19/23 0007 03/19/23 0353 03/19/23 0500 03/19/23 0723  BP: (!) 145/70 128/81  138/89  Pulse: 95 97  (!) 102  Resp: 20 20  20   Temp: 98.4 F (36.9 C) 97.9 F (36.6 C)  98 F (36.7 C)  TempSrc: Oral Oral  Oral  SpO2: 92% 95%  96%  Weight:   114.4 kg   Height:        Intake/Output Summary (Last 24 hours) at 03/19/2023 0914 Last data filed at 03/19/2023 0645 Gross per 24 hour  Intake 605.04 ml  Output 900 ml  Net -294.96 ml      03/19/2023    5:00 AM 03/18/2023    1:38 PM 01/05/2018    5:05 AM  Last 3 Weights  Weight (lbs) 252 lb 3.3 oz 270 lb 238 lb 8.6 oz  Weight (kg) 114.4 kg 122.471 kg 108.2 kg      Telemetry/ECG    Sinus rhythm/sinus tachycardia - Personally Reviewed  Physical Exam .   GEN: No acute distress.   Neck: No JVD Cardiac: RRR, tachycardic on exam, no murmurs, rubs, or gallops.  Respiratory: RLL rhonchi GI: Soft, nontender, non-distended  MS: No edema  Assessment & Plan .    James Guzman is a 63 y.o. male with a hx of hypertension, hyperlipidemia, diabetes, CAD s/p PCI with stent to OM2 in 2019 who is being seen for the evaluation of chest pain, elevated troponins at the request of Dr. Fredderick Phenix.    NSTEMI Hx CAD  Patient with hx PCI to OM2 in 2019, has been followed by cardiology at [facility]. He was seen 08/15/2022 with James Schwab, PA. At this time he was having episodes of chest pain and shortness of breath with his last ischemic evaluation being 2019 they decided to proceed with a nuclear stress test. His stress test results were: normal left ventricular function, normal wall motion, no evidence for scar or ischemia. He was then seen post stress test on 10/02/2022  denying any symptoms in between his previous visit. He presented to the ED with exertional chest pain and dyspnea. Troponin found elevated: 3562->3530. D-dimer negative, but notable leukocytosis 17.8. CXR with patchy RLL and RML opacities suspicious for pneumonia. EKG with RBBB.  Given CAD hx, troponin elevation concerning for NSTEMI/ACS. In the setting of respiratory symptoms, labs, imaging consistent with pneumonia, tentative plans for [facility] on Friday 1/24 after patient has been stabilized from a respiratory standpoint.  Continue heparin, ASA 81mg  PTA med list shows Coreg 3.125mg  BID but no recent fill hx. Given tachycardia (which may be physiologic in the setting of PNA), will resume beta blocker with pending echo. Metoprolol Tartrate 12.5mg  BID.   Informed Consent   Shared Decision Making/Informed Consent The risks [stroke (1 in 1000), death (1 in 1000), kidney failure [usually temporary] (1 in 500), bleeding (1 in 200), allergic reaction [possibly serious] (1 in 200)], benefits (diagnostic support and management of coronary artery disease) and alternatives of a cardiac catheterization were discussed in detail with James Guzman and he is willing to proceed.      Hypertension  BP generally well controlled this admission. PTA regimen included Lisinopril 20mg , Amlodipine 5mg .  Patient put back  on lower dose Amlodipine 2.5mg  on 1/22. Lisinopril held pending LHC. If LVEF decreased, would consider ARB>ACEI at discharge.  As above, Metoprolol Tartrate 12.5mg  BID today.   Hyperlipidemia  Patient with hx intolerance to statins and Zetia. Per notes, has previously declined PCSK9 agents. LDL this admission 109 with total cholesterol 177, triglycerides 135.  Per patient, there was never a formal discussion of starting PCSK9. Discussed his lipid panel and risks. He seems to be agreeable to PCSK9 now. Would need lipid clinic referral at d/c.   Per primary team: PNA DM  For questions or updates,  please contact [facility] Please consult [email or web address] for contact info under        Signed, Perlie Gold, PA-C   Agree with note by Perlie Gold, PA-C  Patient feeling clinically improved on antibiotics for pneumonia.  Troponins peaked at 3500.  White count coming down from 17,000 and 13,000.  Exam benign.  Plan coronary angiography tomorrow.  Runell Gess, M.D., FACP, [facility], [facility] [address] [place], [place]  [address]  [phone] 03/19/2023 10:36 AM

## 2023-03-19 NOTE — Progress Notes (Signed)
PHARMACY - ANTICOAGULATION CONSULT NOTE  Pharmacy Consult for Heparin Indication:  ACS/STEMI  Allergies  Allergen Reactions   Codeine Other (See Comments)    pain   Statins Other (See Comments)    Muscle aches, Pt states it clenches up his muscles    Patient Measurements: Height: 5\' 6"  (167.6 cm) Weight: 114.4 kg (252 lb 3.3 oz) IBW/kg (Calculated) : 63.8 Heparin Dosing Weight: 92.6kg  Vital Signs: Temp: 98.5 F (36.9 C) (01/23 2020) Temp Source: Oral (01/23 2020) BP: 134/84 (01/23 1631) Pulse Rate: 101 (01/23 1916)  Labs: Recent Labs    03/18/23 1153 03/18/23 1344 03/18/23 1347 03/18/23 2103 03/19/23 0526 03/19/23 1433 03/19/23 2050  HGB  --   --  14.3  --  14.0  --   --   HCT  --   --  41.7  --  41.5  --   --   PLT  --   --  245  --  229  --   --   HEPARINUNFRC  --   --   --    < > 0.17* 0.32 0.30  CREATININE 1.05  --   --   --  0.90  --   --   TROPONINIHS 3,562* 3,530*  --   --   --   --   --    < > = values in this interval not displayed.    Estimated Creatinine Clearance: 99.8 mL/min (by C-G formula based on SCr of 0.9 mg/dL).   Assessment: 62YOM who presents with intermittent chest pain, cough, and SOB for over 1 week. Not on [facility] per fill history and chart review. Pharmacy has been consulted to dose heparin.  Heparin level at low end of therapeutic (0.30) on infusion at 1700 units/hr. No bleeding noted.  Goal of Therapy:  Heparin level 0.3-0.7 units/ml Monitor platelets by anticoagulation protocol: Yes   Plan:  Increase heparin to 1750 units/hr to keep pt in therapeutic range Will f/u a.m. heparin level Plan for cardiac cath tomorrow  Christoper Fabian, PharmD, BCPS Please see amion for complete clinical pharmacist phone list 03/19/2023 9:51 PM

## 2023-03-19 NOTE — Progress Notes (Addendum)
PHARMACY - ANTICOAGULATION CONSULT NOTE  Pharmacy Consult for Heparin Indication:  ACS/STEMI  Allergies  Allergen Reactions   Codeine Other (See Comments)    pain   Statins Other (See Comments)    Muscle aches, Pt states it clenches up his muscles    Patient Measurements: Height: 5\' 6"  (167.6 cm) Weight: 114.4 kg (252 lb 3.3 oz) IBW/kg (Calculated) : 63.8 Heparin Dosing Weight: 92.6kg  Vital Signs: Temp: 97.9 F (36.6 C) (01/23 0353) Temp Source: Oral (01/23 0353) BP: 128/81 (01/23 0353) Pulse Rate: 97 (01/23 0353)  Labs: Recent Labs    03/18/23 1153 03/18/23 1344 03/18/23 1347 03/18/23 2103 03/19/23 0526  HGB  --   --  14.3  --  14.0  HCT  --   --  41.7  --  41.5  PLT  --   --  245  --  229  HEPARINUNFRC  --   --   --  0.22* 0.17*  CREATININE 1.05  --   --   --  0.90  TROPONINIHS 3,562* 3,530*  --   --   --     Estimated Creatinine Clearance: 99.8 mL/min (by C-G formula based on SCr of 0.9 mg/dL).   Medical History: Past Medical History:  Diagnosis Date   Diabetes mellitus (HCC) 2019   Hypertension 2019   Medications:  Scheduled:   amLODipine  2.5 mg Oral Daily   aspirin  325 mg Oral Daily   insulin aspart  0-15 Units Subcutaneous TID WC   insulin aspart  0-5 Units Subcutaneous QHS   levothyroxine  175 mcg Oral Q0600   rosuvastatin  20 mg Oral q1800   sodium chloride flush  3 mL Intravenous Q12H   Infusions:   azithromycin (ZITHROMAX) 500 mg in sodium chloride 0.9 % 250 mL IVPB     cefTRIAXone (ROCEPHIN)  IV     heparin 1,400 Units/hr (03/19/23 0645)   Assessment: 62YOM who presents with intermittent chest pain, cough, and SOB for over 1 week. Not on [facility] per fill history and chart review. Pharmacy has been consulted to dose heparin.  -heparin level= 0.17 on 1400 units/hr, CBC stable -Rn reported heparin was off briefly  -plans noted for cath on 1/24  Goal of Therapy:  Heparin level 0.3-0.7 units/ml Monitor platelets by anticoagulation  protocol: Yes   Plan:  -Heparin bolus 2000 units x1 then increase to 1700 units/hr -Heparin level in 6 hours and daily wth CBC daily  Harland German, PharmD Clinical Pharmacist **Pharmacist phone directory can now be found on [email or web address] ([email or web address]).  Listed under [facility].

## 2023-03-19 NOTE — Progress Notes (Signed)
PHARMACY - ANTICOAGULATION CONSULT NOTE  Pharmacy Consult for Heparin Indication:  ACS/STEMI  Allergies  Allergen Reactions   Codeine Other (See Comments)    pain   Statins Other (See Comments)    Muscle aches, Pt states it clenches up his muscles    Patient Measurements: Height: 5\' 6"  (167.6 cm) Weight: 114.4 kg (252 lb 3.3 oz) IBW/kg (Calculated) : 63.8 Heparin Dosing Weight: 92.6kg  Vital Signs: Temp: 98.4 F (36.9 C) (01/23 1631) Temp Source: Oral (01/23 1631) BP: 134/84 (01/23 1631) Pulse Rate: 95 (01/23 1631)  Labs: Recent Labs    03/18/23 1153 03/18/23 1344 03/18/23 1347 03/18/23 2103 03/19/23 0526 03/19/23 1433  HGB  --   --  14.3  --  14.0  --   HCT  --   --  41.7  --  41.5  --   PLT  --   --  245  --  229  --   HEPARINUNFRC  --   --   --  0.22* 0.17* 0.32  CREATININE 1.05  --   --   --  0.90  --   TROPONINIHS 3,562* 3,530*  --   --   --   --     Estimated Creatinine Clearance: 99.8 mL/min (by C-G formula based on SCr of 0.9 mg/dL).   Assessment: 62YOM who presents with intermittent chest pain, cough, and SOB for over 1 week. Not on [facility] per fill history and chart review. Pharmacy has been consulted to dose heparin.  Heparin level therapeutic (0.32) on infusion at 1700 units/hr. No bleeding noted.  Goal of Therapy:  Heparin level 0.3-0.7 units/ml Monitor platelets by anticoagulation protocol: Yes   Plan:  Continue heparin at 1700 units/hr Will f/u 6h heparin level to confirm therapeutic Plan for cardiac cath tomorrow  Christoper Fabian, PharmD, BCPS Please see amion for complete clinical pharmacist phone list 03/19/2023 4:35 PM

## 2023-03-19 NOTE — Hospital Course (Addendum)
The patient is an 63 year old morbidly obese Caucasian male with a past medical history significant for but not limited to dyslipidemia, diabetes mellitus type 2 on oral agents, hypothyroidism, PVD and CAD status post stent to the 19 who presented to the ED from his PCP office after reporting a 3-week history of upper respiratory symptoms that worsened over the last several days.  Symptoms include pleuritic chest pain with coughing shortness of breath on exertion as well as orthopnea.  PCP noted a new right bundle branch block and is concerned over a PE therefore he sent to the ED for further evaluation.  Upon arrival to the ED his O2 saturations were around 95% his D-dimer was normal.  Chest x-ray did demonstrate right-sided opacities and given his cardiac history and reports of chest pain a troponin was checked and was elevated of 3562 and repeat was 3530.  BNP was slightly elevated but were not consistent with volume overload.  Cardiology was consulted and he was initiated on IV antibiotics.  Given his symptoms cardiology recommending a left heart cath and this will be done on Friday, 03/20/2023 after further treatment of his pneumonia and stabilization.  Cardiac cath done and he had 2 drug-eluting stents placed.  He is transition to aspirin and Effient and did well with no dyspnea after.  Cardiology felt that he could be safely discharged and made some further recommendation changes and he is going to follow-up with them and repeat chest x-ray in 3 to 6 weeks as well as thyroid function studies in 4 to 6 weeks.  Assessment and Plan:  Non-STEMI/history of CAD and prior coronary stent to OM 2 in 2019 -Patient was sent to the ED by his PCP because of shortness of breath and chest pain in context of new right bundle branch block -Was found to have elevated troponin x 2 greater than 3000; Continue to trend -Cardiology has consulted and plan is to pursue left heart cath this admission and plan is for  03/20/23 -Continue IV heparin and full dose aspirin; preadmission Plavix on hold -Patient reports last stress test several months ago.  He reports his initial cardiac ischemic equivalent was shortness of breath and dyspnea on exertion -He reports had not been having the symptoms prior to onset of his URI -Echocardiogram done and showed "Left ventricular ejection fraction, by estimation, is 55 to 60%. The left ventricle has normal function. The left ventricle has no regional wall motion abnormalities. Left ventricular diastolic parameters are indeterminate. Right ventricular systolic function is normal." -Lipid Panel as below -Further care per Cardiology and started Metoprolol tartrate 12.5 mg p.o. twice daily and now changed to Toprol XL 25 mg daily for discharge -Left heart cath done and his LVEDP was elevated at 32 mm per mercury and he had mild diffuse disease in the LAD and mid circumflex with 99% stenosis and there was 80 to 90% stenosis of the OM1.  He had successful placement in the drug-eluting stent in the mid circumflex and OM1 with no residual stenosis and placed on aspirin 81 mg daily and Effient 10 mg p.o. daily -Cardiology felt he stable for discharge and consulted and made some recommendations changes.   Right-sided community-acquired pneumonia/question history of COPD -Currently stable on room air but ambulatory O2 sats have not been obtained -Continue IV Rocephin and Zithromax; patient had been taking doxycycline prior to admission starting on 03/16/2023 as well as prednisone Dosepak; changed to Augmentin for discharge -Obtain CRP (3.9), procalcitonin, urinary strep antigen and  if possible expectorated sputum culture -Incentive spirometry and early mobilization -O2 if needed for sats less than or equal to 94% -Will place on Xopenex/Atrovent, 1200 mg po Guaifenesin, Flutter Valve and Incentive Spirometry -Initial CXR done and showed "Patchy right lower lobe and right middle lobe  opacities, suspicious for pneumonia in the acute setting. Blunting of the costophrenic angles, which may represent small pleural effusions. Similar mild cardiomegaly." -Repeat CXR yesterday AM showed No Active Disease     Essential Hypertension -Cardiology has continued Amlodipine 2.5 mg po Daily but at a lower dose -C/w IV Hydralazine 10 mg q6hprn SBP >180 -Continue to Monitor BP per Protocol -Last BP reading was 125/82   Diabetes Mellitus 2 with Hyperglycemia -Glucose greater than 200 at presentation and likely due to acute illness -Checked Hemoglobin A1c and was 6.3 -Follow CBGs and continue Moderate SSI AC/HS -Hold home Metformin especially in context of upcoming left heart cath -Hold preadmission Glipizide -CBG Trend:  Recent Labs  Lab 03/20/23 0621 03/20/23 1114 03/20/23 1640 03/20/23 1951 03/20/23 2235 03/21/23 0732 03/21/23 1151  GLUCAP 135* 110* 125* 128* 195* 130* 128*    Dyslipidemia -Documented that he is statin and Zetia intolerant -Lipid panel and showed a total cholesterol/HDL ratio 4.3, cholesterol level 127, HDL 41, LDL of 109, triglycerides of 135, VLDL 27 -Cardiology has now started him on Rosuvastatin 20 mg po daily and will follow-up in outpatient setting   Hypothyroidism -Continue Levothyroxine 175 mcg po Daily  -Check TSH and it was 13.372   PVD -History of Claudication -Continue efforts to control diabetes and lipid status -Former smoker and will need continued tobacco cessation counseling continue with aspirin and Effient as well as statin in the outpatient setting  Hypophosphatemia -Phos Level Trend: Recent Labs  Lab 03/20/23 0724 03/21/23 0335  PHOS 2.0* 3.2  -Replete with IV K Phos 20 mmol prior to discharge -Continue to Monitor and Replete as Necessary -Repeat CMP within 1 week   Hypokalemia -Patient's K+ Level Trend: Recent Labs  Lab 03/18/23 1153 03/19/23 0526 03/20/23 0724 03/21/23 0335  K 3.7 3.3* 4.0 3.8  -Replete with po  KCL 40 mEQ BID yesterday and IV K Phos 20 mmol -Continue to Monitor and Replete as Necessary -Repeat CMP within 1 week  Hypoalbuminemia -Patient's Albumin Trend: Recent Labs  Lab 03/19/23 0526 03/20/23 0724 03/21/23 0335  ALBUMIN 3.3* 3.3* 3.4*  -Continue to Monitor and Trend and repeat CMP in the AM  Class III Obesity (Morbid) -Complicates overall prognosis and care -Estimated body mass index is 38.72 kg/m as calculated from the following:   Height as of this encounter: 5\' 6"  (1.676 m).   Weight as of this encounter: 108.8 kg.  -Weight Loss and Dietary Counseling given

## 2023-03-20 ENCOUNTER — Encounter (HOSPITAL_COMMUNITY)
Admission: EM | Disposition: A | Discharge status: Home / Self Care | Payer: Self-pay | Source: Home / Self Care | Attending: Internal Medicine

## 2023-03-20 ENCOUNTER — Other Ambulatory Visit (HOSPITAL_COMMUNITY): Payer: Self-pay

## 2023-03-20 ENCOUNTER — Telehealth (HOSPITAL_COMMUNITY): Payer: Self-pay | Admitting: Pharmacy Technician

## 2023-03-20 ENCOUNTER — Inpatient Hospital Stay (HOSPITAL_COMMUNITY): Payer: Managed Care, Other (non HMO)

## 2023-03-20 DIAGNOSIS — I2510 Atherosclerotic heart disease of native coronary artery without angina pectoris: Secondary | ICD-10-CM | POA: Diagnosis not present

## 2023-03-20 DIAGNOSIS — I214 Non-ST elevation (NSTEMI) myocardial infarction: Secondary | ICD-10-CM | POA: Diagnosis not present

## 2023-03-20 HISTORY — PX: CORONARY STENT INTERVENTION: CATH118234

## 2023-03-20 HISTORY — PX: LEFT HEART CATH AND CORONARY ANGIOGRAPHY: CATH118249

## 2023-03-20 LAB — CBC WITH DIFFERENTIAL/PLATELET
Abs Immature Granulocytes: 0.19 10*3/uL — ABNORMAL HIGH (ref 0.00–0.07)
Basophils Absolute: 0.1 10*3/uL (ref 0.0–0.1)
Basophils Relative: 1 %
Eosinophils Absolute: 0.1 10*3/uL (ref 0.0–0.5)
Eosinophils Relative: 1 %
HCT: 42.5 % (ref 39.0–52.0)
Hemoglobin: 14.3 g/dL (ref 13.0–17.0)
Immature Granulocytes: 2 %
Lymphocytes Relative: 26 %
Lymphs Abs: 2.8 10*3/uL (ref 0.7–4.0)
MCH: 32 pg (ref 26.0–34.0)
MCHC: 33.6 g/dL (ref 30.0–36.0)
MCV: 95.1 fL (ref 80.0–100.0)
Monocytes Absolute: 1.1 10*3/uL — ABNORMAL HIGH (ref 0.1–1.0)
Monocytes Relative: 10 %
Neutro Abs: 6.4 10*3/uL (ref 1.7–7.7)
Neutrophils Relative %: 60 %
Platelets: 224 10*3/uL (ref 150–400)
RBC: 4.47 MIL/uL (ref 4.22–5.81)
RDW: 12.8 % (ref 11.5–15.5)
WBC: 10.6 10*3/uL — ABNORMAL HIGH (ref 4.0–10.5)
nRBC: 0 % (ref 0.0–0.2)

## 2023-03-20 LAB — COMPREHENSIVE METABOLIC PANEL
ALT: 53 U/L — ABNORMAL HIGH (ref 0–44)
AST: 38 U/L (ref 15–41)
Albumin: 3.3 g/dL — ABNORMAL LOW (ref 3.5–5.0)
Alkaline Phosphatase: 77 U/L (ref 38–126)
Anion gap: 9 (ref 5–15)
BUN: 14 mg/dL (ref 8–23)
CO2: 22 mmol/L (ref 22–32)
Calcium: 8.7 mg/dL — ABNORMAL LOW (ref 8.9–10.3)
Chloride: 106 mmol/L (ref 98–111)
Creatinine, Ser: 0.93 mg/dL (ref 0.61–1.24)
GFR, Estimated: >60 mL/min (ref >60)
Glucose, Bld: 139 mg/dL — ABNORMAL HIGH (ref 70–99)
Potassium: 4 mmol/L (ref 3.5–5.1)
Sodium: 137 mmol/L (ref 135–145)
Total Bilirubin: 1 mg/dL (ref 0.0–1.2)
Total Protein: 7 g/dL (ref 6.5–8.1)

## 2023-03-20 LAB — MAGNESIUM: Magnesium: 1.9 mg/dL (ref 1.7–2.4)

## 2023-03-20 LAB — POCT ACTIVATED CLOTTING TIME
Activated Clotting Time: 256 s
Activated Clotting Time: 268 s

## 2023-03-20 LAB — GLUCOSE, CAPILLARY
Glucose-Capillary: 135 mg/dL — ABNORMAL HIGH (ref 70–99)
Glucose-Capillary: 110 mg/dL — ABNORMAL HIGH (ref 70–99)
Glucose-Capillary: 125 mg/dL — ABNORMAL HIGH (ref 70–99)
Glucose-Capillary: 128 mg/dL — ABNORMAL HIGH (ref 70–99)
Glucose-Capillary: 195 mg/dL — ABNORMAL HIGH (ref 70–99)

## 2023-03-20 LAB — HEPARIN LEVEL (UNFRACTIONATED): Heparin Unfractionated: 0.37 [IU]/mL (ref 0.30–0.70)

## 2023-03-20 LAB — PHOSPHORUS: Phosphorus: 2 mg/dL — ABNORMAL LOW (ref 2.5–4.6)

## 2023-03-20 SURGERY — LEFT HEART CATH AND CORONARY ANGIOGRAPHY
Anesthesia: LOCAL

## 2023-03-20 MED ORDER — IPRATROPIUM BROMIDE 0.02 % IN SOLN
0.5000 mg | Freq: Three times a day (TID) | RESPIRATORY_TRACT | Status: DC
  Administered 2023-03-21: 0.5 mg via RESPIRATORY_TRACT
  Filled 2023-03-20 (×2): qty 2.5

## 2023-03-20 MED ORDER — DEXTROSE 5 % IV SOLN
20.0000 mmol | Freq: Once | INTRAVENOUS | Status: AC
  Administered 2023-03-20: 20 mmol via INTRAVENOUS
  Filled 2023-03-20: qty 6.67

## 2023-03-20 MED ORDER — SODIUM CHLORIDE 0.9 % WEIGHT BASED INFUSION
1.0000 mL/kg/h | INTRAVENOUS | Status: DC
  Administered 2023-03-20 (×2): 1 mL/kg/h via INTRAVENOUS

## 2023-03-20 MED ORDER — HEPARIN SODIUM (PORCINE) 1000 UNIT/ML IJ SOLN
INTRAMUSCULAR | Status: AC
Start: 2023-03-20 — End: ?
  Filled 2023-03-20: qty 10

## 2023-03-20 MED ORDER — VERAPAMIL HCL 2.5 MG/ML IV SOLN
INTRAVENOUS | Status: AC
Start: 2023-03-20 — End: ?
  Filled 2023-03-20: qty 2

## 2023-03-20 MED ORDER — LEVALBUTEROL HCL 0.63 MG/3ML IN NEBU
0.6300 mg | INHALATION_SOLUTION | Freq: Three times a day (TID) | RESPIRATORY_TRACT | Status: DC
  Administered 2023-03-21: 0.63 mg via RESPIRATORY_TRACT
  Filled 2023-03-20 (×2): qty 3

## 2023-03-20 MED ORDER — HEPARIN SODIUM (PORCINE) 1000 UNIT/ML IJ SOLN
INTRAMUSCULAR | Status: AC
  Filled 2023-03-20: qty 10

## 2023-03-20 MED ORDER — IOHEXOL 350 MG/ML SOLN
INTRAVENOUS | Status: DC | PRN
  Administered 2023-03-20: 150 mL

## 2023-03-20 MED ORDER — FENTANYL CITRATE (PF) 100 MCG/2ML IJ SOLN
INTRAMUSCULAR | Status: AC
  Filled 2023-03-20: qty 2

## 2023-03-20 MED ORDER — NITROGLYCERIN 1 MG/10 ML FOR IR/CATH LAB
INTRA_ARTERIAL | Status: DC | PRN
  Administered 2023-03-20 (×2): 200 ug via INTRACORONARY

## 2023-03-20 MED ORDER — ONDANSETRON HCL 4 MG/2ML IJ SOLN
INTRAMUSCULAR | Status: AC
  Filled 2023-03-20: qty 2

## 2023-03-20 MED ORDER — NITROGLYCERIN 1 MG/10 ML FOR IR/CATH LAB
INTRA_ARTERIAL | Status: AC
  Filled 2023-03-20: qty 10

## 2023-03-20 MED ORDER — POTASSIUM PHOSPHATES 15 MMOLE/5ML IV SOLN: 20.0000 mmol | Freq: Once | INTRAVENOUS | Status: DC

## 2023-03-20 MED ORDER — FENTANYL CITRATE (PF) 100 MCG/2ML IJ SOLN
INTRAMUSCULAR | Status: DC | PRN
  Administered 2023-03-20: 50 ug via INTRAVENOUS
  Administered 2023-03-20: 25 ug via INTRAVENOUS

## 2023-03-20 MED ORDER — LIDOCAINE HCL (PF) 1 % IJ SOLN
INTRAMUSCULAR | Status: DC | PRN
  Administered 2023-03-20: 2 mL

## 2023-03-20 MED ORDER — HEPARIN SODIUM (PORCINE) 1000 UNIT/ML IJ SOLN
INTRAMUSCULAR | Status: DC | PRN
  Administered 2023-03-20: 4000 [IU] via INTRAVENOUS
  Administered 2023-03-20: 5000 [IU] via INTRAVENOUS
  Administered 2023-03-20: 3000 [IU] via INTRAVENOUS

## 2023-03-20 MED ORDER — PRASUGREL HCL 10 MG PO TABS
10.0000 mg | ORAL_TABLET | Freq: Every day | ORAL | Status: DC
  Administered 2023-03-21: 10 mg via ORAL
  Filled 2023-03-20: qty 1

## 2023-03-20 MED ORDER — VERAPAMIL HCL 2.5 MG/ML IV SOLN
INTRAVENOUS | Status: DC | PRN
  Administered 2023-03-20: 10 mL via INTRA_ARTERIAL

## 2023-03-20 MED ORDER — MIDAZOLAM HCL 2 MG/2ML IJ SOLN
INTRAMUSCULAR | Status: AC
Start: 2023-03-20 — End: ?
  Filled 2023-03-20: qty 2

## 2023-03-20 MED ORDER — PRASUGREL HCL 10 MG PO TABS
ORAL_TABLET | ORAL | Status: AC
  Filled 2023-03-20: qty 1

## 2023-03-20 MED ORDER — PRASUGREL HCL 10 MG PO TABS
ORAL_TABLET | ORAL | Status: AC
  Filled 2023-03-20: qty 5

## 2023-03-20 MED ORDER — HEPARIN (PORCINE) IN NACL 1000-0.9 UT/500ML-% IV SOLN
INTRAVENOUS | Status: DC | PRN
  Administered 2023-03-20 (×2): 500 mL

## 2023-03-20 MED ORDER — MIDAZOLAM HCL 2 MG/2ML IJ SOLN
INTRAMUSCULAR | Status: DC | PRN
  Administered 2023-03-20: 2 mg via INTRAVENOUS

## 2023-03-20 MED ORDER — LIDOCAINE HCL (PF) 1 % IJ SOLN
INTRAMUSCULAR | Status: AC
Start: 2023-03-20 — End: ?
  Filled 2023-03-20: qty 30

## 2023-03-20 MED ORDER — PRASUGREL HCL 10 MG PO TABS
ORAL_TABLET | ORAL | Status: DC | PRN
  Administered 2023-03-20: 60 mg via ORAL

## 2023-03-20 SURGICAL SUPPLY — 18 items
BALL SAPPHIRE NC24 3.0X15 (BALLOONS) ×1
BALLN EMERGE MR 2.5X15 (BALLOONS) ×1
BALLOON EMERGE MR 2.5X15 (BALLOONS) IMPLANT
BALLOON SAPPHIRE NC24 3.0X15 (BALLOONS) IMPLANT
CATH INFINITI AMBI 5FR TG (CATHETERS) IMPLANT
CATH VISTA GUIDE 6FR XB3.5 (CATHETERS) ×1
CATH VISTA GUIDE 6FR XB3.5 EPK (CATHETERS) IMPLANT
DEVICE RAD COMP TR BAND LRG (VASCULAR PRODUCTS) IMPLANT
GLIDESHEATH SLEND A-KIT 6F 22G (SHEATH) IMPLANT
GUIDEWIRE INQWIRE 1.5J.035X260 (WIRE) IMPLANT
INQWIRE 1.5J .035X260CM (WIRE) ×1
KIT ENCORE 26 ADVANTAGE (KITS) IMPLANT
KIT SINGLE USE MANIFOLD (KITS) IMPLANT
PACK CARDIAC CATHETERIZATION (CUSTOM PROCEDURE TRAY) ×1 IMPLANT
STENT ONYX FRONTIER 3.0X22 (Permanent Stent) IMPLANT
TUBING CIL FLEX 10 FLL-RA (TUBING) IMPLANT
WIRE ASAHI PROWATER 180CM (WIRE) IMPLANT
WIRE RUNTHROUGH .014X180CM (WIRE) IMPLANT

## 2023-03-20 NOTE — Progress Notes (Signed)
Patient Name: James Guzman Date of Encounter: 03/20/2023 [facility] Cardiologist: Nanetta Batty, MD   Interval Summary  .    Patient resting comfortably in bed this morning. Reports breathing significantly improved.  Vital Signs .    Vitals:   03/20/23 0146 03/20/23 0349 03/20/23 0834 03/20/23 0844  BP:  128/68 128/68 116/77  Pulse: 89 86 86 (!) 101  Resp: (!) 21 20  20   Temp:  98.5 F (36.9 C)  97.7 F (36.5 C)  TempSrc:  Oral  Oral  SpO2: 98% 97%  97%  Weight:  110.1 kg    Height:        Intake/Output Summary (Last 24 hours) at 03/20/2023 0910 Last data filed at 03/20/2023 4782 Gross per 24 hour  Intake 697.92 ml  Output 1500 ml  Net -802.08 ml      03/20/2023    3:49 AM 03/19/2023    5:00 AM 03/18/2023    1:38 PM  Last 3 Weights  Weight (lbs) 242 lb 11.6 oz 252 lb 3.3 oz 270 lb  Weight (kg) 110.1 kg 114.4 kg 122.471 kg      Telemetry/ECG    Sinus rhythm/sinus tachycardia - Personally Reviewed  Physical Exam .   GEN: No acute distress.   Neck: No JVD Cardiac: RRR, tachycardic on exam, no murmurs, rubs, or gallops.  Respiratory: RLL rhonchi GI: Soft, nontender, non-distended  MS: No edema  Assessment & Plan .    James Guzman is a 63 y.o. male with a hx of hypertension, hyperlipidemia, diabetes, CAD s/p PCI with stent to OM2 in 2019 who is being seen for the evaluation of chest pain, elevated troponins at the request of Dr. Fredderick Phenix.    NSTEMI Hx CAD  Patient with hx PCI to OM2 in 2019, has been followed by cardiology at [facility]. He was seen 08/15/2022 with James Schwab, PA. At this time he was having episodes of chest pain and shortness of breath with his last ischemic evaluation being 2019 they decided to proceed with a nuclear stress test. His stress test results were: normal left ventricular function, normal wall motion, no evidence for scar or ischemia. He was then seen post stress test on 10/02/2022 denying any symptoms in  between his previous visit. He presented to the ED with exertional chest pain and dyspnea. Troponin found elevated: 3562->3530. D-dimer negative, but notable leukocytosis 17.8. CXR with patchy RLL and RML opacities suspicious for pneumonia. EKG with RBBB.  Given CAD hx, troponin elevation concerning for NSTEMI/ACS. In the setting of respiratory symptoms, labs, imaging consistent with pneumonia, plans for left heart cath this afternoon.  Continue heparin, ASA 81mg  PTA med list shows Coreg 3.125mg  BID but no recent fill hx. Given tachycardia (which may be physiologic in the setting of PNA), will resume beta blocker with pending echo. Metoprolol Tartrate 12.5mg  BID.   Informed Consent   Shared Decision Making/Informed Consent The risks [stroke (1 in 1000), death (1 in 1000), kidney failure [usually temporary] (1 in 500), bleeding (1 in 200), allergic reaction [possibly serious] (1 in 200)], benefits (diagnostic support and management of coronary artery disease) and alternatives of a cardiac catheterization were discussed in detail with James Guzman and he is willing to proceed.      Hypertension  BP generally well controlled this admission. PTA regimen included Lisinopril 20mg , Amlodipine 5mg .  Patient put back on lower dose Amlodipine 2.5mg  on 1/22. Lisinopril held pending LHC.  2D echo revealed preserved LV  function. As above, Metoprolol Tartrate 12.5mg  BID today.   Hyperlipidemia  Patient with hx intolerance to statins and Zetia. Per notes, has previously declined PCSK9 agents. LDL this admission 109 with total cholesterol 177, triglycerides 135.  Per patient, there was never a formal discussion of starting PCSK9. Discussed his lipid panel and risks. He seems to be agreeable to PCSK9 now. Would need lipid clinic referral at d/c.   Per primary team: PNA DM  Normal LV function by 2D.  Left heart cath pending this afternoon.  Being treated for his community-acquired pneumonia.  Clinically  improved.  White count continues to do crease.  For questions or updates, please contact [facility] Please consult [email or web address] for contact info under     Runell Gess, M.D., FACP, [facility] Kathryne Eriksson [facility] [address] [place], [place]  [address]  [phone] 03/20/2023 9:10 AM

## 2023-03-20 NOTE — Discharge Instructions (Signed)
Information about your medication: Effient (anti-platelet agent)  Generic Name (Brand): prasugrel (Effient), once daily medication  PURPOSE: You are taking this medication along with aspirin to lower your chance of having a heart attack, stroke, or blood clots in your heart stent. These can be fatal. Effient and aspirin help prevent platelets from sticking together and forming a clot that can block an artery or your stent.   Common SIDE EFFECTS you may experience include: bruising or bleeding more easily, shortness of breath  Do not stop taking EFFIENT without talking to the doctor who prescribes it for you. People who are treated with a stent and stop taking Effient too soon, have a higher risk of getting a blood clot in the stent, having a heart attack, or dying. If you stop Effient because of bleeding, or for other reasons, your risk of a heart attack or stroke may increase.   Avoid taking NSAID agents or anti-inflammatory medications such as ibuprofen, naproxen given increased bleed risk with Effient - can use acetaminophen (Tylenol) if needed for pain.  Tell all of your doctors and dentists that you are taking Effient. They should talk to the doctor who prescribed Effient for you before you have any surgery or invasive procedure.   Contact your health care provider if you experience: severe or uncontrollable bleeding, pink/red/brown urine, vomiting blood or vomit that looks like "coffee grounds", red or black stools (looks like tar), coughing up blood or blood clots ----------------------------------------------------------------------------------------------------------------------

## 2023-03-20 NOTE — Progress Notes (Signed)
BBS dec'd but clear; adequate sats on room air. Change xop/atro nebs to TID per RT assessment

## 2023-03-20 NOTE — H&P (View-Only) (Signed)
Patient Name: James Guzman Date of Encounter: 03/20/2023 [facility] Cardiologist: Nanetta Batty, MD   Interval Summary  .    Patient resting comfortably in bed this morning. Reports breathing significantly improved.  Vital Signs .    Vitals:   03/20/23 0146 03/20/23 0349 03/20/23 0834 03/20/23 0844  BP:  128/68 128/68 116/77  Pulse: 89 86 86 (!) 101  Resp: (!) 21 20  20   Temp:  98.5 F (36.9 C)  97.7 F (36.5 C)  TempSrc:  Oral  Oral  SpO2: 98% 97%  97%  Weight:  110.1 kg    Height:        Intake/Output Summary (Last 24 hours) at 03/20/2023 0910 Last data filed at 03/20/2023 4782 Gross per 24 hour  Intake 697.92 ml  Output 1500 ml  Net -802.08 ml      03/20/2023    3:49 AM 03/19/2023    5:00 AM 03/18/2023    1:38 PM  Last 3 Weights  Weight (lbs) 242 lb 11.6 oz 252 lb 3.3 oz 270 lb  Weight (kg) 110.1 kg 114.4 kg 122.471 kg      Telemetry/ECG    Sinus rhythm/sinus tachycardia - Personally Reviewed  Physical Exam .   GEN: No acute distress.   Neck: No JVD Cardiac: RRR, tachycardic on exam, no murmurs, rubs, or gallops.  Respiratory: RLL rhonchi GI: Soft, nontender, non-distended  MS: No edema  Assessment & Plan .    ALDWIN Guzman is a 63 y.o. male with a hx of hypertension, hyperlipidemia, diabetes, CAD s/p PCI with stent to OM2 in 2019 who is being seen for the evaluation of chest pain, elevated troponins at the request of Dr. Fredderick Phenix.    NSTEMI Hx CAD  Patient with hx PCI to OM2 in 2019, has been followed by cardiology at [facility]. He was seen 08/15/2022 with Fermin Schwab, PA. At this time he was having episodes of chest pain and shortness of breath with his last ischemic evaluation being 2019 they decided to proceed with a nuclear stress test. His stress test results were: normal left ventricular function, normal wall motion, no evidence for scar or ischemia. He was then seen post stress test on 10/02/2022 denying any symptoms in  between his previous visit. He presented to the ED with exertional chest pain and dyspnea. Troponin found elevated: 3562->3530. D-dimer negative, but notable leukocytosis 17.8. CXR with patchy RLL and RML opacities suspicious for pneumonia. EKG with RBBB.  Given CAD hx, troponin elevation concerning for NSTEMI/ACS. In the setting of respiratory symptoms, labs, imaging consistent with pneumonia, plans for left heart cath this afternoon.  Continue heparin, ASA 81mg  PTA med list shows Coreg 3.125mg  BID but no recent fill hx. Given tachycardia (which may be physiologic in the setting of PNA), will resume beta blocker with pending echo. Metoprolol Tartrate 12.5mg  BID.   Informed Consent   Shared Decision Making/Informed Consent The risks [stroke (1 in 1000), death (1 in 1000), kidney failure [usually temporary] (1 in 500), bleeding (1 in 200), allergic reaction [possibly serious] (1 in 200)], benefits (diagnostic support and management of coronary artery disease) and alternatives of a cardiac catheterization were discussed in detail with Mr. James Guzman and he is willing to proceed.      Hypertension  BP generally well controlled this admission. PTA regimen included Lisinopril 20mg , Amlodipine 5mg .  Patient put back on lower dose Amlodipine 2.5mg  on 1/22. Lisinopril held pending LHC.  2D echo revealed preserved LV  function. As above, Metoprolol Tartrate 12.5mg  BID today.   Hyperlipidemia  Patient with hx intolerance to statins and Zetia. Per notes, has previously declined PCSK9 agents. LDL this admission 109 with total cholesterol 177, triglycerides 135.  Per patient, there was never a formal discussion of starting PCSK9. Discussed his lipid panel and risks. He seems to be agreeable to PCSK9 now. Would need lipid clinic referral at d/c.   Per primary team: PNA DM  Normal LV function by 2D.  Left heart cath pending this afternoon.  Being treated for his community-acquired pneumonia.  Clinically  improved.  White count continues to do crease.  For questions or updates, please contact [facility] Please consult [email or web address] for contact info under     Runell Gess, M.D., FACP, [facility] Kathryne Eriksson [facility] [address] [place], [place]  [address]  [phone] 03/20/2023 9:10 AM

## 2023-03-20 NOTE — Progress Notes (Signed)
PROGRESS NOTE    ZAKARIYYA HELFMAN  QMV:[id number] DOB: 09/21/1960 DOA: 03/18/2023 PCP: Lonie Peak, PA-C   Brief Narrative:  The patient is an 63 year old morbidly obese Caucasian male with a past medical history significant for but not limited to dyslipidemia, diabetes mellitus type 2 on oral agents, hypothyroidism, PVD and CAD status post stent to the 19 who presented to the ED from his PCP office after reporting a 3-week history of upper respiratory symptoms that worsened over the last several days.  Symptoms include pleuritic chest pain with coughing shortness of breath on exertion as well as orthopnea.  PCP noted a new right bundle branch block and is concerned over a PE therefore he sent to the ED for further evaluation.  Upon arrival to the ED his O2 saturations were around 95% his D-dimer was normal.  Chest x-ray did demonstrate right-sided opacities and given his cardiac history and reports of chest pain a troponin was checked and was elevated of 3562 and repeat was 3530.  BNP was slightly elevated but were not consistent with volume overload.  Cardiology was consulted and he was initiated on IV antibiotics.  Given his symptoms cardiology recommending a left heart cath and this will be done on Friday, 03/20/2023 after further treatment of his pneumonia and stabilization.  Assessment and Plan:  Non-STEMI/history of CAD and prior coronary stent to OM 2 in 2019 -Patient was sent to the ED by his PCP because of shortness of breath and chest pain in context of new right bundle branch block -Was found to have elevated troponin x 2 greater than 3000; Continue to trend -Cardiology has consulted and plan is to pursue left heart cath this admission and plan is for 03/20/23 -Continue IV heparin and full dose aspirin; preadmission Plavix on hold -Patient reports last stress test several months ago.  He reports his initial cardiac ischemic equivalent was shortness of breath and dyspnea on exertion -He  reports had not been having the symptoms prior to onset of his URI -Echocardiogram done and showed "Left ventricular ejection fraction, by estimation, is 55 to 60%. The left ventricle has normal function. The left ventricle has no regional wall motion abnormalities. Left ventricular diastolic parameters are indeterminate. Right ventricular systolic function is normal." -Lipid Panel as below -Further care per Cardiology and started Metoprolol tartrate 12.5 mg p.o. twice daily -Plan for LHC this afternoon    Right-sided community-acquired pneumonia/question history of COPD -Currently stable on room air but ambulatory O2 sats have not been obtained -Continue IV Rocephin and Zithromax; patient had been taking doxycycline prior to admission starting on 03/16/2023 as well as prednisone Dosepak -Obtain CRP (3.9), procalcitonin, urinary strep antigen and if possible expectorated sputum culture -Incentive spirometry and early mobilization -O2 if needed for sats less than or equal to 94% -Will place on Xopenex/Atrovent, 1200 mg po Guaifenesin, Flutter Valve and Incentive Spirometry -Initial CXR done and showed "Patchy right lower lobe and right middle lobe opacities, suspicious for pneumonia in the acute setting. Blunting of the costophrenic angles, which may represent small pleural effusions. Similar mild cardiomegaly." -Repeat CXR this AM showed No Active Disease     Essential Hypertension -Cardiology has continued Amlodipine 2.5 mg po Daily but at a lower dose -C/w IV Hydralazine 10 mg q6hprn SBP >180 -Continue to Monitor BP per Protocol -Last BP reading was 125/82   Diabetes Mellitus 2 with Hyperglycemia -Glucose greater than 200 at presentation and likely due to acute illness -Checked Hemoglobin A1c and was 6.3 -  Follow CBGs and continue Moderate SSI AC/HS -Hold home Metformin especially in context of upcoming left heart cath -Hold preadmission Glipizide -CBG Trend:  Recent Labs  Lab  03/18/23 2131 03/19/23 0634 03/19/23 1221 03/19/23 1633 03/19/23 2131 03/20/23 0621 03/20/23 1114  GLUCAP 135* 138* 116* 126* 152* 135* 110*    Dyslipidemia -Documented that he is statin and Zetia intolerant -Lipid panel and showed a total cholesterol/HDL ratio 4.3, cholesterol level 127, HDL 41, LDL of 109, triglycerides of 135, VLDL 27 -Cardiology has now started him on Rosuvastatin 20 mg po daily    Hypothyroidism -Continue Levothyroxine 175 mcg po Daily  -Check TSH in the AM   PVD -History of Claudication -Continue efforts to control diabetes and lipid status -Former smoker and will need continued tobacco cessation counseling  Hypophosphatemia -Phos Level Trend: Recent Labs  Lab 03/20/23 0724  PHOS 2.0*  -Replete with IV K Phos 20 mmol  -Continue to Monitor and Replete as Necessary -Repeat CMP in the AM   Hypokalemia -Patient's K+ Level Trend: Recent Labs  Lab 03/18/23 1153 03/19/23 0526 03/20/23 0724  K 3.7 3.3* 4.0  -Replete with po KCL 40 mEQ BID yesterday and IV K Phos 20 mmol -Continue to Monitor and Replete as Necessary -Repeat CMP in the AM   Hypoalbuminemia -Patient's Albumin Trend: Recent Labs  Lab 03/19/23 0526 03/20/23 0724  ALBUMIN 3.3* 3.3*  -Continue to Monitor and Trend and repeat CMP in the AM  Class III Obesity (Morbid) -Complicates overall prognosis and care -Estimated body mass index is 39.18 kg/m as calculated from the following:   Height as of this encounter: 5\' 6"  (1.676 m).   Weight as of this encounter: 110.1 kg.  -Weight Loss and Dietary Counseling given   DVT prophylaxis: Anticoagulated with Heparin gtt    Code Status: Full Code Family Communication: No family present at bedside  Disposition Plan:  Level of care: Progressive Status is: Inpatient Remains inpatient appropriate because: Undergoing Cardiac Catheterization   Consultants:  Cardiology   Procedures:  ECHOCARDIOGRAM IMPRESSIONS     1. Left  ventricular ejection fraction, by estimation, is 55 to 60%. The  left ventricle has normal function. The left ventricle has no regional  wall motion abnormalities. Left ventricular diastolic parameters are  indeterminate.   2. Right ventricular systolic function is normal. The right ventricular  size is normal. Tricuspid regurgitation signal is inadequate for assessing  PA pressure.   3. The mitral valve is normal in structure. Mild mitral valve  regurgitation. No evidence of mitral stenosis.   4. The aortic valve is normal in structure. Aortic valve regurgitation is  not visualized. Aortic valve sclerosis/calcification is present, without  any evidence of aortic stenosis.   5. The inferior vena cava is normal in size with greater than 50%  respiratory variability, suggesting right atrial pressure of 3 mmHg.   FINDINGS   Left Ventricle: Left ventricular ejection fraction, by estimation, is 55  to 60%. The left ventricle has normal function. The left ventricle has no  regional wall motion abnormalities. The left ventricular internal cavity  size was normal in size. There is   no left ventricular hypertrophy. Left ventricular diastolic parameters  are indeterminate.   Right Ventricle: The right ventricular size is normal. No increase in  right ventricular wall thickness. Right ventricular systolic function is  normal. Tricuspid regurgitation signal is inadequate for assessing PA  pressure.   Left Atrium: Left atrial size was normal in size.   Right Atrium:  Right atrial size was normal in size.   Pericardium: Trivial pericardial effusion is present. The pericardial  effusion is circumferential.   Mitral Valve: The mitral valve is normal in structure. Mild mitral valve  regurgitation. No evidence of mitral valve stenosis.   Tricuspid Valve: The tricuspid valve is normal in structure. Tricuspid  valve regurgitation is not demonstrated. No evidence of tricuspid  stenosis.   Aortic  Valve: The aortic valve is normal in structure. Aortic valve  regurgitation is not visualized. Aortic valve sclerosis/calcification is  present, without any evidence of aortic stenosis. Aortic valve mean  gradient measures 5.0 mmHg. Aortic valve peak   gradient measures 9.0 mmHg. Aortic valve area, by VTI measures 2.37 cm.   Pulmonic Valve: The pulmonic valve was normal in structure. Pulmonic valve  regurgitation is not visualized. No evidence of pulmonic stenosis.   Aorta: The aortic root is normal in size and structure.   Venous: The inferior vena cava is normal in size with greater than 50%  respiratory variability, suggesting right atrial pressure of 3 mmHg.   IAS/Shunts: No atrial level shunt detected by color flow Doppler.     LEFT VENTRICLE  PLAX 2D  LVIDd:         4.50 cm   Diastology  LVIDs:         3.05 cm   LV e' medial:    7.94 cm/s  LV PW:         1.10 cm   LV E/e' medial:  12.7  LV IVS:        0.95 cm   LV e' lateral:   9.46 cm/s  LVOT diam:     2.10 cm   LV E/e' lateral: 10.7  LV SV:         64  LV SV Index:   29  LVOT Area:     3.46 cm    RIGHT VENTRICLE             IVC  RV S prime:     12.00 cm/s  IVC diam: 1.80 cm  TAPSE (M-mode): 2.2 cm   LEFT ATRIUM             Index        RIGHT ATRIUM           Index  LA diam:        4.30 cm 1.95 cm/m   RA Area:     13.60 cm  LA Vol (A2C):   62.1 ml 28.14 ml/m  RA Volume:   35.00 ml  15.86 ml/m  LA Vol (A4C):   42.4 ml 19.21 ml/m  LA Biplane Vol: 51.4 ml 23.29 ml/m   AORTIC VALVE                    PULMONIC VALVE  AV Area (Vmax):    2.47 cm     PV Vmax:       0.71 m/s  AV Area (Vmean):   2.45 cm     PV Peak grad:  2.0 mmHg  AV Area (VTI):     2.37 cm  AV Vmax:           150.00 cm/s  AV Vmean:          98.750 cm/s  AV VTI:            0.272 m  AV Peak Grad:      9.0 mmHg  AV Mean Grad:  5.0 mmHg  LVOT Vmax:         107.00 cm/s  LVOT Vmean:        69.900 cm/s  LVOT VTI:          0.186 m  LVOT/AV VTI  ratio: 0.68    AORTA  Ao Root diam: 3.00 cm  Ao Asc diam:  3.30 cm   MITRAL VALVE  MV Area (PHT): 5.13 cm     SHUNTS  MV Decel Time: 148 msec     Systemic VTI:  0.19 m  MV E velocity: 101.00 cm/s  Systemic Diam: 2.10 cm  MV A velocity: 102.00 cm/s  MV E/A ratio:  0.99   Antimicrobials:  Anti-infectives (From admission, onward)    Start     Dose/Rate Route Frequency Ordered Stop   03/19/23 1000  azithromycin (ZITHROMAX) 500 mg in sodium chloride 0.9 % 250 mL IVPB        500 mg 250 mL/hr over 60 Minutes Intravenous Every 24 hours 03/18/23 1536     03/19/23 1000  cefTRIAXone (ROCEPHIN) 1 g in sodium chloride 0.9 % 100 mL IVPB        1 g 200 mL/hr over 30 Minutes Intravenous Every 24 hours 03/18/23 1536     03/18/23 1415  cefTRIAXone (ROCEPHIN) 1 g in sodium chloride 0.9 % 100 mL IVPB        1 g 200 mL/hr over 30 Minutes Intravenous  Once 03/18/23 1413 03/18/23 1859   03/18/23 1415  azithromycin (ZITHROMAX) 500 mg in sodium chloride 0.9 % 250 mL IVPB        500 mg 250 mL/hr over 60 Minutes Intravenous  Once 03/18/23 1413 03/18/23 1859       Subjective: Seen and examined at bedside and patient was No chest pain or shortness breath.  No nausea or vomiting.  Understands he is going for a cardiac cath.  No other concerns or complaints at this time.  Objective: Vitals:   03/20/23 0349 03/20/23 0834 03/20/23 0844 03/20/23 1115  BP: 128/68 128/68 116/77 125/82  Pulse: 86 86 (!) 101 79  Resp: 20  20 (!) 23  Temp: 98.5 F (36.9 C)  97.7 F (36.5 C) 97.8 F (36.6 C)  TempSrc: Oral  Oral Oral  SpO2: 97%  97% 96%  Weight: 110.1 kg     Height:        Intake/Output Summary (Last 24 hours) at 03/20/2023 1258 Last data filed at 03/20/2023 1125 Gross per 24 hour  Intake 697.92 ml  Output 2350 ml  Net -1652.08 ml   Filed Weights   03/18/23 1338 03/19/23 0500 03/20/23 0349  Weight: 122.5 kg 114.4 kg 110.1 kg   Examination: Physical Exam:  Constitutional: WN/WD obese  Caucasian male in NAD Respiratory: Diminished to auscultation bilaterally, no wheezing, rales, rhonchi or crackles. Normal respiratory effort and patient is not tachypenic. No accessory muscle use. Unlabored breathing  Cardiovascular: RRR, no murmurs / rubs / gallops. S1 and S2 auscultated. Trace extremity edema.  Abdomen: Soft, non-tender, distended 2/2 body habitus. Bowel sounds positive.  GU: Deferred. Musculoskeletal: No clubbing / cyanosis of digits/nails. No joint deformity in the upper and lower extremities. Skin: No rashes, lesions, ulcers on a limited skin evaluation. No induration; Warm and dry.  Neurologic: CN 2-12 grossly intact with no focal deficits. Romberg sign and cerebellar reflexes not assessed.  Psychiatric: Normal judgment and insight. Alert and oriented x 3. Normal mood and appropriate affect.   Data Reviewed: I  have personally reviewed following labs and imaging studies  CBC: Recent Labs  Lab 03/18/23 1347 03/19/23 0526 03/20/23 0724  WBC 17.8* 13.7* 10.6*  NEUTROABS  --   --  6.4  HGB 14.3 14.0 14.3  HCT 41.7 41.5 42.5  MCV 96.1 95.4 95.1  PLT 245 229 224   Basic Metabolic Panel: Recent Labs  Lab 03/18/23 1153 03/19/23 0526 03/20/23 0724  NA 135 132* 137  K 3.7 3.3* 4.0  CL 104 99 106  CO2 20* 23 22  GLUCOSE 222* 142* 139*  BUN 16 14 14   CREATININE 1.05 0.90 0.93  CALCIUM 8.7* 8.4* 8.7*  MG  --   --  1.9  PHOS  --   --  2.0*   GFR: Estimated Creatinine Clearance: 94.6 mL/min (by C-G formula based on SCr of 0.93 mg/dL). Liver Function Tests: Recent Labs  Lab 03/19/23 0526 03/20/23 0724  AST 39 38  ALT 46* 53*  ALKPHOS 78 77  BILITOT 1.0 1.0  PROT 6.8 7.0  ALBUMIN 3.3* 3.3*   No results for input(s): "LIPASE", "AMYLASE" in the last 168 hours. No results for input(s): "AMMONIA" in the last 168 hours. Coagulation Profile: No results for input(s): "INR", "PROTIME" in the last 168 hours. Cardiac Enzymes: No results for input(s):  "CKTOTAL", "CKMB", "CKMBINDEX", "TROPONINI" in the last 168 hours. BNP (last 3 results) No results for input(s): "PROBNP" in the last 8760 hours. HbA1C: Recent Labs    03/18/23 1826  HGBA1C 6.3*   CBG: Recent Labs  Lab 03/19/23 1221 03/19/23 1633 03/19/23 2131 03/20/23 0621 03/20/23 1114  GLUCAP 116* 126* 152* 135* 110*   Lipid Profile: Recent Labs    03/19/23 0526  CHOL 177  HDL 41  LDLCALC 109*  TRIG 135  CHOLHDL 4.3   Thyroid Function Tests: No results for input(s): "TSH", "T4TOTAL", "FREET4", "T3FREE", "THYROIDAB" in the last 72 hours. Anemia Panel: No results for input(s): "VITAMINB12", "FOLATE", "FERRITIN", "TIBC", "IRON", "RETICCTPCT" in the last 72 hours. Sepsis Labs: Recent Labs  Lab 03/18/23 1826  PROCALCITON <0.10    Recent Results (from the past 240 hours)  Resp panel by RT-PCR (RSV, Flu A&B, Covid) Anterior Nasal Swab     Status: None   Collection Time: 03/18/23  1:44 PM   Specimen: Anterior Nasal Swab  Result Value Ref Range Status   SARS Coronavirus 2 by RT PCR NEGATIVE NEGATIVE Final   Influenza A by PCR NEGATIVE NEGATIVE Final   Influenza B by PCR NEGATIVE NEGATIVE Final    Comment: (NOTE) The Xpert Xpress SARS-CoV-2/FLU/RSV plus assay is intended as an aid in the diagnosis of influenza from Nasopharyngeal swab specimens and should not be used as a sole basis for treatment. Nasal washings and aspirates are unacceptable for Xpert Xpress SARS-CoV-2/FLU/RSV testing.  Fact Sheet for Patients: BloggerCourse.com  Fact Sheet for Healthcare Providers: SeriousBroker.it  This test is not yet approved or cleared by the [place] FDA and has been authorized for detection and/or diagnosis of SARS-CoV-2 by FDA under an Emergency Use Authorization (EUA). This EUA will remain in effect (meaning this test can be used) for the duration of the COVID-19 declaration under Section 564(b)(1) of the Act, 21  U.S.C. section 360bbb-3(b)(1), unless the authorization is terminated or revoked.     Resp Syncytial Virus by PCR NEGATIVE NEGATIVE Final    Comment: (NOTE) Fact Sheet for Patients: BloggerCourse.com  Fact Sheet for Healthcare Providers: SeriousBroker.it  This test is not yet approved or cleared by the [place]  [place] FDA and has been authorized for detection and/or diagnosis of SARS-CoV-2 by FDA under an Emergency Use Authorization (EUA). This EUA will remain in effect (meaning this test can be used) for the duration of the COVID-19 declaration under Section 564(b)(1) of the Act, 21 U.S.C. section 360bbb-3(b)(1), unless the authorization is terminated or revoked.  Performed at [facility], [address], [place], [place] [address]     Radiology Studies: DG CHEST PORT 1 VIEW Result Date: 03/20/2023 CLINICAL DATA:  Cough and shortness of breath. EXAM: PORTABLE CHEST 1 VIEW COMPARISON:  Chest x-ray from yesterday. FINDINGS: Stable cardiomegaly. Normal pulmonary vascularity. No focal consolidation, pleural effusion, or pneumothorax. No acute osseous abnormality. IMPRESSION: 1. No active disease. Electronically Signed   By: Obie Dredge M.D.   On: 03/20/2023 10:08   DG CHEST PORT 1 VIEW Result Date: 03/19/2023 CLINICAL DATA:  Shortness of breath EXAM: PORTABLE CHEST 1 VIEW COMPARISON:  X-ray 03/18/2023 FINDINGS: Previous subtle opacity at the right lung base is decreasing today. No pneumothorax, effusion or edema. Stable cardiopericardial so appear prominent central vasculature. Overlapping cardiac leads. IMPRESSION: Decreasing lung base opacity. Electronically Signed   By: Karen Kays M.D.   On: 03/19/2023 18:27   ECHOCARDIOGRAM COMPLETE Result Date: 03/19/2023    ECHOCARDIOGRAM REPORT   Patient Name:   RAMIEL FORTI Date of Exam: 03/19/2023 Medical Rec #:  [id number]         Height:       66.0 in Accession #:    [id number]         Weight:       252.2 lb Date of Birth:  April 05, 1960         BSA:          2.207 m Patient Age:    62 years          BP:           138/89 mmHg Patient Gender: M                 HR:           97 bpm. Exam Location:  Inpatient Procedure: 2D Echo, Color Doppler and Cardiac Doppler Indications:    CAD  History:        Patient has no prior history of Echocardiogram examinations.                 CAD; Risk Factors:Hypertension, Diabetes, Dyslipidemia and                 Former Smoker.  Sonographer:    Dondra Prader RVT RCS Referring Phys: Evlyn Clines, A  Sonographer Comments: Technically challenging study due to limited acoustic windows, Technically difficult study due to poor echo windows, suboptimal apical window, suboptimal parasternal window and suboptimal subcostal window. Image acquisition challenging due to patient body habitus. IMPRESSIONS  1. Left ventricular ejection fraction, by estimation, is 55 to 60%. The left ventricle has normal function. The left ventricle has no regional wall motion abnormalities. Left ventricular diastolic parameters are indeterminate.  2. Right ventricular systolic function is normal. The right ventricular size is normal. Tricuspid regurgitation signal is inadequate for assessing PA pressure.  3. The mitral valve is normal in structure. Mild mitral valve regurgitation. No evidence of mitral stenosis.  4. The aortic valve is normal in structure. Aortic valve regurgitation is not visualized. Aortic valve sclerosis/calcification is present, without any evidence of aortic stenosis.  5. The inferior vena cava is normal  in size with greater than 50% respiratory variability, suggesting right atrial pressure of 3 mmHg. FINDINGS  Left Ventricle: Left ventricular ejection fraction, by estimation, is 55 to 60%. The left ventricle has normal function. The left ventricle has no regional wall motion abnormalities. The left ventricular internal cavity size was normal in size. There is  no left  ventricular hypertrophy. Left ventricular diastolic parameters are indeterminate. Right Ventricle: The right ventricular size is normal. No increase in right ventricular wall thickness. Right ventricular systolic function is normal. Tricuspid regurgitation signal is inadequate for assessing PA pressure. Left Atrium: Left atrial size was normal in size. Right Atrium: Right atrial size was normal in size. Pericardium: Trivial pericardial effusion is present. The pericardial effusion is circumferential. Mitral Valve: The mitral valve is normal in structure. Mild mitral valve regurgitation. No evidence of mitral valve stenosis. Tricuspid Valve: The tricuspid valve is normal in structure. Tricuspid valve regurgitation is not demonstrated. No evidence of tricuspid stenosis. Aortic Valve: The aortic valve is normal in structure. Aortic valve regurgitation is not visualized. Aortic valve sclerosis/calcification is present, without any evidence of aortic stenosis. Aortic valve mean gradient measures 5.0 mmHg. Aortic valve peak  gradient measures 9.0 mmHg. Aortic valve area, by VTI measures 2.37 cm. Pulmonic Valve: The pulmonic valve was normal in structure. Pulmonic valve regurgitation is not visualized. No evidence of pulmonic stenosis. Aorta: The aortic root is normal in size and structure. Venous: The inferior vena cava is normal in size with greater than 50% respiratory variability, suggesting right atrial pressure of 3 mmHg. IAS/Shunts: No atrial level shunt detected by color flow Doppler.  LEFT VENTRICLE PLAX 2D LVIDd:         4.50 cm   Diastology LVIDs:         3.05 cm   LV e' medial:    7.94 cm/s LV PW:         1.10 cm   LV E/e' medial:  12.7 LV IVS:        0.95 cm   LV e' lateral:   9.46 cm/s LVOT diam:     2.10 cm   LV E/e' lateral: 10.7 LV SV:         64 LV SV Index:   29 LVOT Area:     3.46 cm  RIGHT VENTRICLE             IVC RV S prime:     12.00 cm/s  IVC diam: 1.80 cm TAPSE (M-mode): 2.2 cm LEFT ATRIUM              Index        RIGHT ATRIUM           Index LA diam:        4.30 cm 1.95 cm/m   RA Area:     13.60 cm LA Vol (A2C):   62.1 ml 28.14 ml/m  RA Volume:   35.00 ml  15.86 ml/m LA Vol (A4C):   42.4 ml 19.21 ml/m LA Biplane Vol: 51.4 ml 23.29 ml/m  AORTIC VALVE                    PULMONIC VALVE AV Area (Vmax):    2.47 cm     PV Vmax:       0.71 m/s AV Area (Vmean):   2.45 cm     PV Peak grad:  2.0 mmHg AV Area (VTI):     2.37 cm AV Vmax:  150.00 cm/s AV Vmean:          98.750 cm/s AV VTI:            0.272 m AV Peak Grad:      9.0 mmHg AV Mean Grad:      5.0 mmHg LVOT Vmax:         107.00 cm/s LVOT Vmean:        69.900 cm/s LVOT VTI:          0.186 m LVOT/AV VTI ratio: 0.68  AORTA Ao Root diam: 3.00 cm Ao Asc diam:  3.30 cm MITRAL VALVE MV Area (PHT): 5.13 cm     SHUNTS MV Decel Time: 148 msec     Systemic VTI:  0.19 m MV E velocity: 101.00 cm/s  Systemic Diam: 2.10 cm MV A velocity: 102.00 cm/s MV E/A ratio:  0.99 Kardie Tobb DO Electronically signed by Thomasene Ripple DO Signature Date/Time: 03/19/2023/12:57:03 PM    Final    Scheduled Meds:  amLODipine  2.5 mg Oral Daily   aspirin EC  81 mg Oral Daily   guaiFENesin  1,200 mg Oral BID   insulin aspart  0-15 Units Subcutaneous TID WC   insulin aspart  0-5 Units Subcutaneous QHS   ipratropium  0.5 mg Nebulization TID   levalbuterol  0.63 mg Nebulization TID   levothyroxine  175 mcg Oral Q0600   metoprolol tartrate  12.5 mg Oral BID   rosuvastatin  20 mg Oral q1800   sodium chloride flush  3 mL Intravenous Q12H   Continuous Infusions:  sodium chloride 1 mL/kg/hr (03/20/23 0649)   azithromycin (ZITHROMAX) 500 mg in sodium chloride 0.9 % 250 mL IVPB 500 mg (03/20/23 1036)   cefTRIAXone (ROCEPHIN)  IV 1 g (03/20/23 0954)   heparin 1,750 Units/hr (03/20/23 0544)   potassium PHOSPHATE 20 mmol in dextrose 5 % 250 mL infusion      LOS: 2 days   Marguerita Merles, DO [facility] Available via Epic secure chat 7am-7pm After these hours,  please refer to coverage provider listed on [email or web address] 03/20/2023, 12:58 PM

## 2023-03-20 NOTE — Telephone Encounter (Signed)
Patient Product/process development scientist completed.    The patient is insured through  [employer] [place] . Patient has [employer], may use a copay card, and/or apply for patient assistance if available.    Ran test claim for Farxiga 10 mg and the current 30 day co-pay is $25.00.  Ran test claim for Jardiance 10 mg and the current 30 day co-pay is $25.00.  This test claim was processed through [facility]- copay amounts may vary at other pharmacies due to pharmacy/plan contracts, or as the patient moves through the different stages of their insurance plan.     Roland Earl, CPHT Pharmacy Technician III Certified Patient Advocate [facility] Patient Advocate Team Direct Number: [phone]  Fax: [phone]

## 2023-03-20 NOTE — Interval H&P Note (Signed)
History and Physical Interval Note:  03/20/2023 2:49 PM  James Guzman  has presented today for surgery, with the diagnosis of NSTEMI.  The various methods of treatment have been discussed with the patient and family. After consideration of risks, benefits and other options for treatment, the patient has consented to  Procedure(s): LEFT HEART CATH AND CORONARY ANGIOGRAPHY (N/A) and possible coronary angioplasty as a surgical intervention.  The patient's history has been reviewed, patient examined, no change in status, stable for surgery.  I have reviewed the patient's chart and labs.  Questions were answered to the patient's satisfaction.     Yates Decamp

## 2023-03-21 ENCOUNTER — Other Ambulatory Visit (HOSPITAL_COMMUNITY): Payer: Self-pay

## 2023-03-21 DIAGNOSIS — Z9861 Coronary angioplasty status: Secondary | ICD-10-CM | POA: Diagnosis not present

## 2023-03-21 DIAGNOSIS — I2510 Atherosclerotic heart disease of native coronary artery without angina pectoris: Secondary | ICD-10-CM | POA: Diagnosis not present

## 2023-03-21 DIAGNOSIS — I214 Non-ST elevation (NSTEMI) myocardial infarction: Secondary | ICD-10-CM | POA: Diagnosis not present

## 2023-03-21 LAB — CBC WITH DIFFERENTIAL/PLATELET
Abs Immature Granulocytes: 0.19 10*3/uL — ABNORMAL HIGH (ref 0.00–0.07)
Basophils Absolute: 0.1 10*3/uL (ref 0.0–0.1)
Basophils Relative: 1 %
Eosinophils Absolute: 0.2 10*3/uL (ref 0.0–0.5)
Eosinophils Relative: 1 %
HCT: 43.8 % (ref 39.0–52.0)
Hemoglobin: 14.7 g/dL (ref 13.0–17.0)
Immature Granulocytes: 2 %
Lymphocytes Relative: 22 %
Lymphs Abs: 2.5 10*3/uL (ref 0.7–4.0)
MCH: 32 pg (ref 26.0–34.0)
MCHC: 33.6 g/dL (ref 30.0–36.0)
MCV: 95.2 fL (ref 80.0–100.0)
Monocytes Absolute: 1.2 10*3/uL — ABNORMAL HIGH (ref 0.1–1.0)
Monocytes Relative: 10 %
Neutro Abs: 7.1 10*3/uL (ref 1.7–7.7)
Neutrophils Relative %: 64 %
Platelets: 238 10*3/uL (ref 150–400)
RBC: 4.6 MIL/uL (ref 4.22–5.81)
RDW: 12.9 % (ref 11.5–15.5)
WBC: 11.1 10*3/uL — ABNORMAL HIGH (ref 4.0–10.5)
nRBC: 0 % (ref 0.0–0.2)

## 2023-03-21 LAB — COMPREHENSIVE METABOLIC PANEL
ALT: 45 U/L — ABNORMAL HIGH (ref 0–44)
AST: 31 U/L (ref 15–41)
Albumin: 3.4 g/dL — ABNORMAL LOW (ref 3.5–5.0)
Alkaline Phosphatase: 71 U/L (ref 38–126)
Anion gap: 9 (ref 5–15)
BUN: 13 mg/dL (ref 8–23)
CO2: 23 mmol/L (ref 22–32)
Calcium: 8.8 mg/dL — ABNORMAL LOW (ref 8.9–10.3)
Chloride: 105 mmol/L (ref 98–111)
Creatinine, Ser: 0.96 mg/dL (ref 0.61–1.24)
GFR, Estimated: >60 mL/min (ref >60)
Glucose, Bld: 123 mg/dL — ABNORMAL HIGH (ref 70–99)
Potassium: 3.8 mmol/L (ref 3.5–5.1)
Sodium: 137 mmol/L (ref 135–145)
Total Bilirubin: 0.9 mg/dL (ref 0.0–1.2)
Total Protein: 6.9 g/dL (ref 6.5–8.1)

## 2023-03-21 LAB — TSH: TSH: 13.372 u[IU]/mL — ABNORMAL HIGH (ref 0.350–4.500)

## 2023-03-21 LAB — GLUCOSE, CAPILLARY
Glucose-Capillary: 130 mg/dL — ABNORMAL HIGH (ref 70–99)
Glucose-Capillary: 128 mg/dL — ABNORMAL HIGH (ref 70–99)

## 2023-03-21 LAB — MAGNESIUM: Magnesium: 2 mg/dL (ref 1.7–2.4)

## 2023-03-21 LAB — PHOSPHORUS: Phosphorus: 3.2 mg/dL (ref 2.5–4.6)

## 2023-03-21 MED ORDER — ROSUVASTATIN CALCIUM 20 MG PO TABS
20.0000 mg | ORAL_TABLET | Freq: Every day | ORAL | 0 refills | Status: DC
  Filled 2023-03-21: qty 30, 30d supply, fill #0

## 2023-03-21 MED ORDER — PRASUGREL HCL 10 MG PO TABS
10.0000 mg | ORAL_TABLET | Freq: Every day | ORAL | 0 refills | Status: DC
  Filled 2023-03-21: qty 30, 30d supply, fill #0

## 2023-03-21 MED ORDER — ONDANSETRON HCL 4 MG PO TABS
4.0000 mg | ORAL_TABLET | Freq: Four times a day (QID) | ORAL | 0 refills | Status: DC | PRN
  Filled 2023-03-21: qty 20, 5d supply, fill #0

## 2023-03-21 MED ORDER — AZITHROMYCIN 250 MG PO TABS: 500.0000 mg | ORAL_TABLET | Freq: Once | ORAL | Status: DC

## 2023-03-21 MED ORDER — METOPROLOL SUCCINATE ER 25 MG PO TB24: 25.0000 mg | ORAL_TABLET | Freq: Every day | ORAL | Status: DC

## 2023-03-21 MED ORDER — AMOXICILLIN-POT CLAVULANATE 875-125 MG PO TABS
1.0000 | ORAL_TABLET | Freq: Two times a day (BID) | ORAL | 0 refills | Status: AC
  Filled 2023-03-21: qty 4, 2d supply, fill #0

## 2023-03-21 MED ORDER — METOPROLOL SUCCINATE ER 25 MG PO TB24
25.0000 mg | ORAL_TABLET | Freq: Every day | ORAL | 0 refills | Status: DC
  Filled 2023-03-21: qty 30, 30d supply, fill #0

## 2023-03-21 MED ORDER — AMOXICILLIN-POT CLAVULANATE 875-125 MG PO TABS: 1.0000 | ORAL_TABLET | Freq: Two times a day (BID) | ORAL | Status: DC

## 2023-03-21 MED ORDER — GUAIFENESIN ER 600 MG PO TB12
600.0000 mg | ORAL_TABLET | Freq: Two times a day (BID) | ORAL | 0 refills | Status: AC
  Filled 2023-03-21: qty 10, 5d supply, fill #0

## 2023-03-21 MED ORDER — AMLODIPINE BESYLATE 2.5 MG PO TABS
2.5000 mg | ORAL_TABLET | Freq: Every day | ORAL | 0 refills | Status: DC
  Filled 2023-03-21: qty 30, 30d supply, fill #0

## 2023-03-21 NOTE — Discharge Summary (Signed)
Physician Discharge Summary   Patient: James Guzman MRN: [id number] DOB: 09-11-1960  Admit date:     03/18/2023  Discharge date: 03/21/23  Discharge Physician: Marguerita Merles, DO   PCP: Lonie Peak, PA-C   Recommendations at discharge:  {Tip this will not be part of the note when signed- Example include specific recommendations for outpatient follow-up, pending tests to follow-up on. (Optional):26781}  ***  Discharge Diagnoses: Principal Problem:   NSTEMI (non-ST elevation myocardial infarction) (HCC)  Resolved Problems:   * No resolved hospital problems. [facility] Course: The patient is an 63 year old morbidly obese Caucasian male with a past medical history significant for but not limited to dyslipidemia, diabetes mellitus type 2 on oral agents, hypothyroidism, PVD and CAD status post stent to the 19 who presented to the ED from his PCP office after reporting a 3-week history of upper respiratory symptoms that worsened over the last several days.  Symptoms include pleuritic chest pain with coughing shortness of breath on exertion as well as orthopnea.  PCP noted a new right bundle branch block and is concerned over a PE therefore he sent to the ED for further evaluation.  Upon arrival to the ED his O2 saturations were around 95% his D-dimer was normal.  Chest x-ray did demonstrate right-sided opacities and given his cardiac history and reports of chest pain a troponin was checked and was elevated of 3562 and repeat was 3530.  BNP was slightly elevated but were not consistent with volume overload.  Cardiology was consulted and he was initiated on IV antibiotics.  Given his symptoms cardiology recommending a left heart cath and this will be done on Friday, 03/20/2023 after further treatment of his pneumonia and stabilization.  Assessment and Plan:  Non-STEMI/history of CAD and prior coronary stent to OM 2 in 2019 -Patient was sent to the ED by his PCP because of shortness of breath  and chest pain in context of new right bundle branch block -Was found to have elevated troponin x 2 greater than 3000; Continue to trend -Cardiology has consulted and plan is to pursue left heart cath this admission and plan is for 03/20/23 -Continue IV heparin and full dose aspirin; preadmission Plavix on hold -Patient reports last stress test several months ago.  He reports his initial cardiac ischemic equivalent was shortness of breath and dyspnea on exertion -He reports had not been having the symptoms prior to onset of his URI -Echocardiogram done and showed "Left ventricular ejection fraction, by estimation, is 55 to 60%. The left ventricle has normal function. The left ventricle has no regional wall motion abnormalities. Left ventricular diastolic parameters are indeterminate. Right ventricular systolic function is normal." -Lipid Panel as below -Further care per Cardiology and started Metoprolol tartrate 12.5 mg p.o. twice daily -Plan for LHC this afternoon    Right-sided community-acquired pneumonia/question history of COPD -Currently stable on room air but ambulatory O2 sats have not been obtained -Continue IV Rocephin and Zithromax; patient had been taking doxycycline prior to admission starting on 03/16/2023 as well as prednisone Dosepak -Obtain CRP (3.9), procalcitonin, urinary strep antigen and if possible expectorated sputum culture -Incentive spirometry and early mobilization -O2 if needed for sats less than or equal to 94% -Will place on Xopenex/Atrovent, 1200 mg po Guaifenesin, Flutter Valve and Incentive Spirometry -Initial CXR done and showed "Patchy right lower lobe and right middle lobe opacities, suspicious for pneumonia in the acute setting. Blunting of the costophrenic angles, which may represent small pleural effusions. Similar  mild cardiomegaly." -Repeat CXR this AM showed No Active Disease     Essential Hypertension -Cardiology has continued Amlodipine 2.5 mg po Daily  but at a lower dose -C/w IV Hydralazine 10 mg q6hprn SBP >180 -Continue to Monitor BP per Protocol -Last BP reading was 125/82   Diabetes Mellitus 2 with Hyperglycemia -Glucose greater than 200 at presentation and likely due to acute illness -Checked Hemoglobin A1c and was 6.3 -Follow CBGs and continue Moderate SSI AC/HS -Hold home Metformin especially in context of upcoming left heart cath -Hold preadmission Glipizide -CBG Trend:  Recent Labs  Lab 03/19/23 2131 03/20/23 0621 03/20/23 1114 03/20/23 1640 03/20/23 1951 03/20/23 2235 03/21/23 0732  GLUCAP 152* 135* 110* 125* 128* 195* 130*    Dyslipidemia -Documented that he is statin and Zetia intolerant -Lipid panel and showed a total cholesterol/HDL ratio 4.3, cholesterol level 127, HDL 41, LDL of 109, triglycerides of 135, VLDL 27 -Cardiology has now started him on Rosuvastatin 20 mg po daily    Hypothyroidism -Continue Levothyroxine 175 mcg po Daily  -Check TSH in the AM   PVD -History of Claudication -Continue efforts to control diabetes and lipid status -Former smoker and will need continued tobacco cessation counseling  Hypophosphatemia -Phos Level Trend: Recent Labs  Lab 03/20/23 0724 03/21/23 0335  PHOS 2.0* 3.2  -Replete with IV K Phos 20 mmol  -Continue to Monitor and Replete as Necessary -Repeat CMP in the AM   Hypokalemia -Patient's K+ Level Trend: Recent Labs  Lab 03/18/23 1153 03/19/23 0526 03/20/23 0724 03/21/23 0335  K 3.7 3.3* 4.0 3.8  -Replete with po KCL 40 mEQ BID yesterday and IV K Phos 20 mmol -Continue to Monitor and Replete as Necessary -Repeat CMP in the AM   Hypoalbuminemia -Patient's Albumin Trend: Recent Labs  Lab 03/19/23 0526 03/20/23 0724 03/21/23 0335  ALBUMIN 3.3* 3.3* 3.4*  -Continue to Monitor and Trend and repeat CMP in the AM  Class III Obesity (Morbid) -Complicates overall prognosis and care -Estimated body mass index is 38.72 kg/m as calculated from the  following:   Height as of this encounter: 5\' 6"  (1.676 m).   Weight as of this encounter: 108.8 kg.  -Weight Loss and Dietary Counseling given   Assessment and Plan: No notes have been filed under this hospital service. Service: Hospitalist     {Tip this will not be part of the note when signed Body mass index is 38.72 kg/m. , ,  (Optional):26781}  {(NOTE) Pain control PDMP Statment (Optional):26782} Consultants: *** Procedures performed: ***  Disposition: {Plan; Disposition:26390} Diet recommendation:  Discharge Diet Orders (From admission, onward)     Start     Ordered   03/21/23 0000  Diet - low sodium heart healthy        03/21/23 1250   03/21/23 0000  Diet Carb Modified        03/21/23 1250           {Diet_Plan:26776} DISCHARGE MEDICATION: Allergies as of 03/21/2023       Reactions   Codeine Other (See Comments)   pain   Statins Other (See Comments)   Muscle aches, Pt states it clenches up his muscles        Medication List     STOP taking these medications    carvedilol 3.125 MG tablet Commonly known as: COREG   clopidogrel 75 MG tablet Commonly known as: PLAVIX   doxycycline 100 MG capsule Commonly known as: VIBRAMYCIN   lisinopril 10 MG tablet Commonly  known as: ZESTRIL   losartan 50 MG tablet Commonly known as: COZAAR   meloxicam 15 MG tablet Commonly known as: MOBIC   predniSONE 20 MG tablet Commonly known as: DELTASONE       TAKE these medications    albuterol 108 (90 Base) MCG/ACT inhaler Commonly known as: VENTOLIN HFA Inhale 1 puff into the lungs every 4 (four) hours as needed for shortness of breath.   amLODipine 2.5 MG tablet Commonly known as: NORVASC Take 1 tablet (2.5 mg total) by mouth daily. Start taking on: March 22, 2023 What changed:  medication strength how much to take how to take this when to take this   amoxicillin-clavulanate 875-125 MG tablet Commonly known as: AUGMENTIN Take 1 tablet by mouth  every 12 (twelve) hours for 2 days. Start taking on: March 22, 2023   aspirin EC 81 MG tablet Take 81 mg by mouth daily.   Cetirizine HCl 10 MG Caps Take 10 mg by mouth daily as needed (seasonal allergies).   cyanocobalamin 1000 MCG tablet Commonly known as: VITAMIN B12 Take 1,000 mcg by mouth every other day.   glipiZIDE 5 MG 24 hr tablet Commonly known as: GLUCOTROL XL Take 5 mg by mouth daily.   guaiFENesin 600 MG 12 hr tablet Commonly known as: MUCINEX Take 1 tablet (600 mg total) by mouth 2 (two) times daily for 5 days.   levothyroxine 175 MCG tablet Commonly known as: SYNTHROID Take 175 mcg by mouth daily before breakfast. What changed: Another medication with the same name was removed. Continue taking this medication, and follow the directions you see here.   metFORMIN 500 MG 24 hr tablet Commonly known as: GLUCOPHAGE-XR Take 1,000 mg by mouth 2 (two) times daily with a meal.   metoprolol succinate 25 MG 24 hr tablet Commonly known as: TOPROL-XL Take 1 tablet (25 mg total) by mouth daily. Start taking on: March 22, 2023   ondansetron 4 MG tablet Commonly known as: ZOFRAN Take 1 tablet (4 mg total) by mouth every 6 (six) hours as needed for nausea.   prasugrel 10 MG Tabs tablet Commonly known as: EFFIENT Take 1 tablet (10 mg total) by mouth daily. Start taking on: March 22, 2023   rosuvastatin 20 MG tablet Commonly known as: CRESTOR Take 1 tablet (20 mg total) by mouth daily at 6 PM. What changed:  medication strength how much to take        Discharge Exam: Filed Weights   03/20/23 0349 03/20/23 1958 03/21/23 0451  Weight: 110.1 kg 109.5 kg 108.8 kg   ***  Condition at discharge: {DC Condition:26389}  The results of significant diagnostics from this hospitalization (including imaging, microbiology, ancillary and laboratory) are listed below for reference.   Imaging Studies: CARDIAC CATHETERIZATION Result Date: 03/20/2023 Images from the  original result were not included.   1st Mrg-1 lesion is 90% stenosed.   Prox Cx lesion is 99% stenosed.   Post intervention, there is a 0% residual stenosis.   Post intervention, there is a 0% residual stenosis. Left Heart Catheterization 03/20/23: Hemodynamic data: LVEDP markedly elevated at 32 mmHg.  There is no pressure gradient across the aortic valve. Angiographic data: LM: Mildly calcified but disease-free. LAD: Large-caliber vessel, has mild diffuse 10 to 20% scattered stenosis.  Again calcification is noted in the proximal and mid LAD.  Diagonal branches are small with mild disease. LCx: Very large caliber vessel, mid segment has a 99% stenosis, it gives origin to large OM1 with acute  angle takeoff, Onyx 3.0 x 15 mm stent placed in 2019 the proximal and mid segment of OM1 is widely patent however there is stenosis of 80 to 90%.  OM 2 is large and disease-free. RI: Small caliber vessel, mild disease. RCA: Mild scattered 20 to 30% stenosis.  No change from prior catheterization in 2019. Intervention data: Successful stenting of the mid CX and OM1 with implantation of a 3.0 x 22 mm Onyx frontier DES followed by kissing balloon angioplasty into the main body of the CX distal to the OM1.  Stenosis reduced from 99% to 0% with TIMI-3 to TIMI-3 flow. Impression and recommendations: Patient will need aspirin indefinitely and Effient for a period of 1 year.  Continued risk factor modification indicated.   DG CHEST PORT 1 VIEW Result Date: 03/20/2023 CLINICAL DATA:  Cough and shortness of breath. EXAM: PORTABLE CHEST 1 VIEW COMPARISON:  Chest x-ray from yesterday. FINDINGS: Stable cardiomegaly. Normal pulmonary vascularity. No focal consolidation, pleural effusion, or pneumothorax. No acute osseous abnormality. IMPRESSION: 1. No active disease. Electronically Signed   By: Obie Dredge M.D.   On: 03/20/2023 10:08   DG CHEST PORT 1 VIEW Result Date: 03/19/2023 CLINICAL DATA:  Shortness of breath EXAM: PORTABLE  CHEST 1 VIEW COMPARISON:  X-ray 03/18/2023 FINDINGS: Previous subtle opacity at the right lung base is decreasing today. No pneumothorax, effusion or edema. Stable cardiopericardial so appear prominent central vasculature. Overlapping cardiac leads. IMPRESSION: Decreasing lung base opacity. Electronically Signed   By: Karen Kays M.D.   On: 03/19/2023 18:27   ECHOCARDIOGRAM COMPLETE Result Date: 03/19/2023    ECHOCARDIOGRAM REPORT   Patient Name:   James Guzman Date of Exam: 03/19/2023 Medical Rec #:  [id number]         Height:       66.0 in Accession #:    [id number]        Weight:       252.2 lb Date of Birth:  1960-04-29         BSA:          2.207 m Patient Age:    62 years          BP:           138/89 mmHg Patient Gender: M                 HR:           97 bpm. Exam Location:  Inpatient Procedure: 2D Echo, Color Doppler and Cardiac Doppler Indications:    CAD  History:        Patient has no prior history of Echocardiogram examinations.                 CAD; Risk Factors:Hypertension, Diabetes, Dyslipidemia and                 Former Smoker.  Sonographer:    Dondra Prader RVT RCS Referring Phys: Evlyn Clines, A  Sonographer Comments: Technically challenging study due to limited acoustic windows, Technically difficult study due to poor echo windows, suboptimal apical window, suboptimal parasternal window and suboptimal subcostal window. Image acquisition challenging due to patient body habitus. IMPRESSIONS  1. Left ventricular ejection fraction, by estimation, is 55 to 60%. The left ventricle has normal function. The left ventricle has no regional wall motion abnormalities. Left ventricular diastolic parameters are indeterminate.  2. Right ventricular systolic function is normal. The right ventricular size is normal. Tricuspid regurgitation signal is inadequate for assessing  PA pressure.  3. The mitral valve is normal in structure. Mild mitral valve regurgitation. No evidence of mitral stenosis.  4. The  aortic valve is normal in structure. Aortic valve regurgitation is not visualized. Aortic valve sclerosis/calcification is present, without any evidence of aortic stenosis.  5. The inferior vena cava is normal in size with greater than 50% respiratory variability, suggesting right atrial pressure of 3 mmHg. FINDINGS  Left Ventricle: Left ventricular ejection fraction, by estimation, is 55 to 60%. The left ventricle has normal function. The left ventricle has no regional wall motion abnormalities. The left ventricular internal cavity size was normal in size. There is  no left ventricular hypertrophy. Left ventricular diastolic parameters are indeterminate. Right Ventricle: The right ventricular size is normal. No increase in right ventricular wall thickness. Right ventricular systolic function is normal. Tricuspid regurgitation signal is inadequate for assessing PA pressure. Left Atrium: Left atrial size was normal in size. Right Atrium: Right atrial size was normal in size. Pericardium: Trivial pericardial effusion is present. The pericardial effusion is circumferential. Mitral Valve: The mitral valve is normal in structure. Mild mitral valve regurgitation. No evidence of mitral valve stenosis. Tricuspid Valve: The tricuspid valve is normal in structure. Tricuspid valve regurgitation is not demonstrated. No evidence of tricuspid stenosis. Aortic Valve: The aortic valve is normal in structure. Aortic valve regurgitation is not visualized. Aortic valve sclerosis/calcification is present, without any evidence of aortic stenosis. Aortic valve mean gradient measures 5.0 mmHg. Aortic valve peak  gradient measures 9.0 mmHg. Aortic valve area, by VTI measures 2.37 cm. Pulmonic Valve: The pulmonic valve was normal in structure. Pulmonic valve regurgitation is not visualized. No evidence of pulmonic stenosis. Aorta: The aortic root is normal in size and structure. Venous: The inferior vena cava is normal in size with greater  than 50% respiratory variability, suggesting right atrial pressure of 3 mmHg. IAS/Shunts: No atrial level shunt detected by color flow Doppler.  LEFT VENTRICLE PLAX 2D LVIDd:         4.50 cm   Diastology LVIDs:         3.05 cm   LV e' medial:    7.94 cm/s LV PW:         1.10 cm   LV E/e' medial:  12.7 LV IVS:        0.95 cm   LV e' lateral:   9.46 cm/s LVOT diam:     2.10 cm   LV E/e' lateral: 10.7 LV SV:         64 LV SV Index:   29 LVOT Area:     3.46 cm  RIGHT VENTRICLE             IVC RV S prime:     12.00 cm/s  IVC diam: 1.80 cm TAPSE (M-mode): 2.2 cm LEFT ATRIUM             Index        RIGHT ATRIUM           Index LA diam:        4.30 cm 1.95 cm/m   RA Area:     13.60 cm LA Vol (A2C):   62.1 ml 28.14 ml/m  RA Volume:   35.00 ml  15.86 ml/m LA Vol (A4C):   42.4 ml 19.21 ml/m LA Biplane Vol: 51.4 ml 23.29 ml/m  AORTIC VALVE                    PULMONIC  VALVE AV Area (Vmax):    2.47 cm     PV Vmax:       0.71 m/s AV Area (Vmean):   2.45 cm     PV Peak grad:  2.0 mmHg AV Area (VTI):     2.37 cm AV Vmax:           150.00 cm/s AV Vmean:          98.750 cm/s AV VTI:            0.272 m AV Peak Grad:      9.0 mmHg AV Mean Grad:      5.0 mmHg LVOT Vmax:         107.00 cm/s LVOT Vmean:        69.900 cm/s LVOT VTI:          0.186 m LVOT/AV VTI ratio: 0.68  AORTA Ao Root diam: 3.00 cm Ao Asc diam:  3.30 cm MITRAL VALVE MV Area (PHT): 5.13 cm     SHUNTS MV Decel Time: 148 msec     Systemic VTI:  0.19 m MV E velocity: 101.00 cm/s  Systemic Diam: 2.10 cm MV A velocity: 102.00 cm/s MV E/A ratio:  0.99 Kardie Tobb DO Electronically signed by Thomasene Ripple DO Signature Date/Time: 03/19/2023/12:57:03 PM    Final    DG Chest 2 View Result Date: 03/18/2023 CLINICAL DATA:  Chest pain, cough, and shortness of breath EXAM: CHEST - 2 VIEW COMPARISON:  Chest radiograph dated 06/22/2009 FINDINGS: Normal lung volumes. Patchy right lower lobe and right middle lobe opacities. Blunting of the bilateral costophrenic angles. No  pneumothorax. Similar mildly enlarged cardiomediastinal silhouette. No acute osseous abnormality. IMPRESSION: 1. Patchy right lower lobe and right middle lobe opacities, suspicious for pneumonia in the acute setting. 2. Blunting of the costophrenic angles, which may represent small pleural effusions. 3. Similar mild cardiomegaly. Electronically Signed   By: Agustin Cree M.D.   On: 03/18/2023 13:04    Microbiology: Results for orders placed or performed during the hospital encounter of 03/18/23  Resp panel by RT-PCR (RSV, Flu A&B, Covid) Anterior Nasal Swab     Status: None   Collection Time: 03/18/23  1:44 PM   Specimen: Anterior Nasal Swab  Result Value Ref Range Status   SARS Coronavirus 2 by RT PCR NEGATIVE NEGATIVE Final   Influenza A by PCR NEGATIVE NEGATIVE Final   Influenza B by PCR NEGATIVE NEGATIVE Final    Comment: (NOTE) The Xpert Xpress SARS-CoV-2/FLU/RSV plus assay is intended as an aid in the diagnosis of influenza from Nasopharyngeal swab specimens and should not be used as a sole basis for treatment. Nasal washings and aspirates are unacceptable for Xpert Xpress SARS-CoV-2/FLU/RSV testing.  Fact Sheet for Patients: BloggerCourse.com  Fact Sheet for Healthcare Providers: SeriousBroker.it  This test is not yet approved or cleared by the [place] FDA and has been authorized for detection and/or diagnosis of SARS-CoV-2 by FDA under an Emergency Use Authorization (EUA). This EUA will remain in effect (meaning this test can be used) for the duration of the COVID-19 declaration under Section 564(b)(1) of the Act, 21 U.S.C. section 360bbb-3(b)(1), unless the authorization is terminated or revoked.     Resp Syncytial Virus by PCR NEGATIVE NEGATIVE Final    Comment: (NOTE) Fact Sheet for Patients: BloggerCourse.com  Fact Sheet for Healthcare  Providers: SeriousBroker.it  This test is not yet approved or cleared by the [place] FDA and has been authorized for detection and/or diagnosis  of SARS-CoV-2 by FDA under an Emergency Use Authorization (EUA). This EUA will remain in effect (meaning this test can be used) for the duration of the COVID-19 declaration under Section 564(b)(1) of the Act, 21 U.S.C. section 360bbb-3(b)(1), unless the authorization is terminated or revoked.  Performed at [facility], [address], [place], [place] [address]     Labs: CBC: Recent Labs  Lab 03/18/23 1347 03/19/23 0526 03/20/23 0724 03/21/23 0335  WBC 17.8* 13.7* 10.6* 11.1*  NEUTROABS  --   --  6.4 7.1  HGB 14.3 14.0 14.3 14.7  HCT 41.7 41.5 42.5 43.8  MCV 96.1 95.4 95.1 95.2  PLT 245 229 224 238   Basic Metabolic Panel: Recent Labs  Lab 03/18/23 1153 03/19/23 0526 03/20/23 0724 03/21/23 0335  NA 135 132* 137 137  K 3.7 3.3* 4.0 3.8  CL 104 99 106 105  CO2 20* 23 22 23   GLUCOSE 222* 142* 139* 123*  BUN 16 14 14 13   CREATININE 1.05 0.90 0.93 0.96  CALCIUM 8.7* 8.4* 8.7* 8.8*  MG  --   --  1.9 2.0  PHOS  --   --  2.0* 3.2   Liver Function Tests: Recent Labs  Lab 03/19/23 0526 03/20/23 0724 03/21/23 0335  AST 39 38 31  ALT 46* 53* 45*  ALKPHOS 78 77 71  BILITOT 1.0 1.0 0.9  PROT 6.8 7.0 6.9  ALBUMIN 3.3* 3.3* 3.4*   CBG: Recent Labs  Lab 03/20/23 1640 03/20/23 1951 03/20/23 2235 03/21/23 0732 03/21/23 1151  GLUCAP 125* 128* 195* 130* 128*    Discharge time spent: {LESS THAN/GREATER THAN:26388} 30 minutes.  Signed: Merlene Laughter, DO [facility] 03/21/2023

## 2023-03-21 NOTE — Progress Notes (Signed)
CARDIAC REHAB PHASE I   PRE:  Rate/Rhythm: 85  BP:  Sit: 126/69     SaO2: 95RA  MODE:  Ambulation: 470 ft   POST:  Rate/Rhythm: 108  BP:  Sit: 132/71      SaO2: 95RA  Pt tolerated exercise well and AMB 470 ft with no assistive device, and standby assist. Pt had no rest break, chest pain, SOB or pain. Education given to pt on heart healthy diet, radial/femoral weight restrictions, adherence to NTG, Brilinta and ASA.  Home exercise guidelines given and will refer to cardiac rehab phase 2 [place]. Pt left in the bed with call bell in reach. All questions were answered and pt verbalized understanding.  Harrie Jeans ACSM-CEP 03/21/2023 10:52 AM

## 2023-03-21 NOTE — Progress Notes (Addendum)
Patient Name: James Guzman Date of Encounter: 03/21/2023 [facility] Cardiologist: Nanetta Batty, MD   Interval Summary  .    Patient reports no events overnight. He denies any chest pain, shortness of breath or palpitations. He has been ambulating without any issues Has not had any shortness of breath, on RA Right radial site healing well   Vital Signs .    Vitals:   03/21/23 0138 03/21/23 0451 03/21/23 0733 03/21/23 0739  BP: (!) 119/55 132/79 127/78   Pulse: 88 94    Resp: 20 18 17    Temp: 98.3 F (36.8 C) 98.4 F (36.9 C) 98.2 F (36.8 C)   TempSrc: Oral Oral Oral   SpO2:  96%  97%  Weight:  108.8 kg    Height:        Intake/Output Summary (Last 24 hours) at 03/21/2023 0817 Last data filed at 03/21/2023 0545 Gross per 24 hour  Intake 123 ml  Output 3525 ml  Net -3402 ml      03/21/2023    4:51 AM 03/20/2023    7:58 PM 03/20/2023    3:49 AM  Last 3 Weights  Weight (lbs) 239 lb 14.4 oz 241 lb 8 oz 242 lb 11.6 oz  Weight (kg) 108.818 kg 109.544 kg 110.1 kg     Telemetry/ECG    Sinus tachycardia, HR 106-110 - Personally Reviewed  Physical Exam .   GEN: No acute distress.   Neck: No JVD Cardiac: regular rhythm, tachycardic, no murmurs.  Respiratory: Clear to auscultation bilaterally. GI: Soft, nontender, non-distended  MS: No edema, right radial site healing well  Assessment & Plan .     NSTEMI CAD s/p PCI 2019, 03/20/2023 PCI from 12/2017 with stent to OM2 Patient presented with exertional chest pain and shortness of breath Elevated troponins 3,562 > 3,530 Echo from 03/19/23 showed: LVEF 55-60%, no RWMA, mild MR Underwent LHC 03/20/23: LVEDP elevated at 32 mmHg, mild diffuse disease in LAD, mid LCx 99% stenosed, 80-90% stenosis of OM1. Successful placement of DES in mid LCx and OM1 with no residual stenosis  Patient will need ASA indefinitely and Effient for at least 1 year  Continue ASA 81 mg daily  Continue Effient 10 mg daily   Continue Crestor 20 mg daily  Continue Lopressor 12.5 mg BID -- consolidate to Toprol XL 25 mg daily at discharge   Hypertension Most recent BP 127/78 Continue amlodipine 2.5 mg daily Continue Lopressor 12.5 mg BID -- consider consolidation to Toprol XL 25 mg daily at discharge   Hyperlipidemia Lipid panel 03/19/23: total 127, HDL 41, LDL of 109, triglycerides of 135  Patient reported intolerant to statins/Zetia in past  Discussed maybe starting PCSK9i in future, patient agreeable, needs referral to lipid clinic at discharge  Continue Crestor 20 mg daily   Per primary Diabetes  Pneumonia  Hypothyroidism Electrolyte disturbances   [facility] will sign off.   Medication Recommendations: Aspirin 81 mg daily, Effient 10 mg daily, Crestor 20 mg daily, Toprol-XL 25 mg daily, amlodipine 2.5 mg daily Other recommendations (labs, testing, etc):  None Follow up as an outpatient:  Will schedule   For questions or updates, please contact [facility] Please consult [email or web address] for contact info under       Signed, Olena Leatherwood, PA-C   Patient seen and examined.  Agree with above documentation.  On exam, patient is alert and oriented, regular rate and rhythm, no murmurs, lungs CTAB, no LE edema or  JVD.  Underwent cath yesterday with severe stenosis in mid LCx and OM1 status post stenting.  Currently pain-free, okay for discharge today.  Discharge on aspirin, Effient.  Little Ishikawa, MD

## 2023-03-23 ENCOUNTER — Encounter (HOSPITAL_COMMUNITY): Payer: Self-pay | Admitting: Cardiology

## 2023-03-24 ENCOUNTER — Other Ambulatory Visit: Payer: Self-pay | Admitting: *Deleted

## 2023-03-30 ENCOUNTER — Encounter: Payer: Self-pay | Admitting: Cardiovascular Disease

## 2023-03-30 ENCOUNTER — Ambulatory Visit: Payer: Managed Care, Other (non HMO) | Attending: Cardiovascular Disease | Admitting: Cardiovascular Disease

## 2023-03-30 VITALS — BP 98/52 | HR 97 | Ht 66.0 in | Wt 241.0 lb

## 2023-03-30 DIAGNOSIS — E785 Hyperlipidemia, unspecified: Secondary | ICD-10-CM | POA: Insufficient documentation

## 2023-03-30 DIAGNOSIS — I10 Essential (primary) hypertension: Secondary | ICD-10-CM | POA: Diagnosis not present

## 2023-03-30 DIAGNOSIS — E782 Mixed hyperlipidemia: Secondary | ICD-10-CM | POA: Diagnosis not present

## 2023-03-30 DIAGNOSIS — I214 Non-ST elevation (NSTEMI) myocardial infarction: Secondary | ICD-10-CM

## 2023-03-30 MED ORDER — METOPROLOL SUCCINATE ER 25 MG PO TB24: 25.0000 mg | ORAL_TABLET | Freq: Every day | ORAL | 3 refills | Status: DC

## 2023-03-30 MED ORDER — PRASUGREL HCL 10 MG PO TABS: 10.0000 mg | ORAL_TABLET | Freq: Every day | ORAL | 3 refills | Status: DC

## 2023-03-30 MED ORDER — ROSUVASTATIN CALCIUM 20 MG PO TABS: 20.0000 mg | ORAL_TABLET | Freq: Every day | ORAL | 3 refills | Status: DC

## 2023-03-30 MED ORDER — AMLODIPINE BESYLATE 2.5 MG PO TABS: 2.5000 mg | ORAL_TABLET | Freq: Every day | ORAL | 3 refills | Status: DC

## 2023-03-30 NOTE — Assessment & Plan Note (Signed)
Patient recently was admitted with a non-STEMI on 03/18/2023.  He has a previous history of obtuse marginal branch stenting by Dr. Kirke Corin.  in 2019.  His troponins went up to 3500.  He underwent catheterization by Dr. Jacinto Halim revealing high-grade AV groove circumflex stenosis and stenosis into the proximal OM which was stented with 1 drug-eluting stent.  He had no other significant CAD and he had normal LV function.  He remains on low-dose aspirin and prasugrel.

## 2023-03-30 NOTE — Assessment & Plan Note (Signed)
History of essential hypertension on low-dose amlodipine and metoprolol blood pressure measured today at 98/52.

## 2023-03-30 NOTE — Assessment & Plan Note (Signed)
History of hyperlipidemia on rosuvastatin with lipid profile performed during his hospitalization 03/19/2023 revealing total cholesterol 177, LDL 109 and HDL 41.  Will recheck a lipid liver profile in 3 months.

## 2023-03-30 NOTE — Progress Notes (Signed)
03/30/2023 James Guzman   05-26-1960  [id number]  Primary Physician Lonie Peak, PA-C Primary Cardiologist: Runell Gess MD Nicholes Calamity, [place]  HPI:  James Guzman is a 63 y.o. 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  I saw him during his recent hospitalization when he came in with pneumonia and a non-STEMI.  He has a history of treated hypertension, hyperlipidemia and diabetes.  He smoked remotely and stopped in 2011.  Did have an OM stent placed by Dr. Kirke Corin in [place] in 2019.  Catheterization performed by Dr. Jacinto Halim revealed high-grade disease in the AV groove circumflex proximal to the OM takeoff as well as in the proximal OM which was stented with a 3 mm x 22 mm long Medtronic Onyx frontier drug-eluting stent.  He had no other significant CAD.  His LV function was normal.  He was discharged home on aspirin and prasugrel.   Current Meds  Medication Sig   albuterol (VENTOLIN HFA) 108 (90 Base) MCG/ACT inhaler Inhale 1 puff into the lungs every 4 (four) hours as needed for shortness of breath.   aspirin EC 81 MG tablet Take 81 mg by mouth daily.   Cetirizine HCl 10 MG CAPS Take 10 mg by mouth daily as needed (seasonal allergies).   glipiZIDE (GLUCOTROL XL) 5 MG 24 hr tablet Take 5 mg by mouth daily.   levothyroxine (SYNTHROID, LEVOTHROID) 175 MCG tablet Take 175 mcg by mouth daily before breakfast.   metFORMIN (GLUCOPHAGE-XR) 500 MG 24 hr tablet Take 1,000 mg by mouth 2 (two) times daily with a meal.   ondansetron (ZOFRAN) 4 MG tablet Take 1 tablet (4 mg total) by mouth every 6 (six) hours as needed for nausea.   [DISCONTINUED] amLODipine (NORVASC) 2.5 MG tablet Take 1 tablet (2.5 mg total) by mouth daily.   [DISCONTINUED] metoprolol succinate (TOPROL-XL) 25 MG 24 hr tablet Take 1 tablet (25 mg total) by mouth daily.   [DISCONTINUED] prasugrel (EFFIENT) 10 MG TABS tablet Take 1 tablet (10 mg  total) by mouth daily.   [DISCONTINUED] rosuvastatin (CRESTOR) 20 MG tablet Take 1 tablet (20 mg total) by mouth daily at 6 PM.     Allergies  Allergen Reactions   Codeine Other (See Comments)    pain   Statins Other (See Comments)    Muscle aches, Pt states it clenches up his muscles    Social History   Socioeconomic History   Marital status: Single    Spouse name: Victorino Dike    Number of children: 1   Years of education: Not on file   Highest education level: Not on file  Occupational History   Occupation: Lobbyist     Comment: [employer]   Tobacco Use   Smoking status: Former    Current packs/day: 0.00    Types: Cigarettes    Quit date: 2011    Years since quitting: 14.1   Smokeless tobacco: Never  Vaping Use   Vaping status: Never Used  Substance and Sexual Activity   Alcohol use: Not Currently    Comment: quit about 15 years ago    Drug use: Not Currently    Types: Marijuana, Cocaine    Comment: quit drugs years ago    Sexual activity: Yes  Other Topics Concern   Not on file  Social History Narrative   Not on file   Social Drivers of Health  Financial Resource Strain: Low Risk  (01/04/2018)   Overall Financial Resource Strain (CARDIA)    Difficulty of Paying Living Expenses: Not very hard  Food Insecurity: No Food Insecurity (03/20/2023)   Hunger Vital Sign    Worried About Running Out of Food in the Last Year: Never true    Ran Out of Food in the Last Year: Never true  Transportation Needs: No Transportation Needs (03/20/2023)   PRAPARE - Administrator, Civil Service (Medical): No    Lack of Transportation (Non-Medical): No  Physical Activity: Inactive (01/04/2018)   Exercise Vital Sign    Days of Exercise per Week: 0 days    Minutes of Exercise per Session: 0 min  Stress: No Stress Concern Present (01/04/2018)   [employer] of Occupational Health - Occupational Stress Questionnaire    Feeling of Stress : Only a  little  Social Connections: Unknown (01/04/2018)   Social Connection and Isolation Panel [NHANES]    Frequency of Communication with Friends and Family: Three times a week    Frequency of Social Gatherings with Friends and Family: Once a week    Attends Religious Services: Never    Database administrator or Organizations: Not on file    Attends Banker Meetings: Not on file    Marital Status: Married  Intimate Partner Violence: Not At Risk (03/20/2023)   Humiliation, Afraid, Rape, and Kick questionnaire    Fear of Current or Ex-Partner: No    Emotionally Abused: No    Physically Abused: No    Sexually Abused: No     Review of Systems: General: negative for chills, fever, night sweats or weight changes.  Cardiovascular: negative for chest pain, dyspnea on exertion, edema, orthopnea, palpitations, paroxysmal nocturnal dyspnea or shortness of breath Dermatological: negative for rash Respiratory: negative for cough or wheezing Urologic: negative for hematuria Abdominal: negative for nausea, vomiting, diarrhea, bright red blood per rectum, melena, or hematemesis Neurologic: negative for visual changes, syncope, or dizziness All other systems reviewed and are otherwise negative except as noted above.    Blood pressure (!) 98/52, pulse 97, height 5\' 6"  (1.676 m), weight 241 lb (109.3 kg), SpO2 96%.  General appearance: alert and no distress Neck: no adenopathy, no carotid bruit, no JVD, supple, symmetrical, trachea midline, and thyroid not enlarged, symmetric, no tenderness/mass/nodules Lungs: clear to auscultation bilaterally Heart: regular rate and rhythm, S1, S2 normal, no murmur, click, rub or gallop Extremities: extremities normal, atraumatic, no cyanosis or edema Pulses: 2+ and symmetric Skin: Skin color, texture, turgor normal. No rashes or lesions Neurologic: Grossly normal  EKG not performed today      ASSESSMENT AND PLAN:   NSTEMI (non-ST elevation  myocardial infarction) [facility]) Patient recently was admitted with a non-STEMI on 03/18/2023.  He has a previous history of obtuse marginal branch stenting by Dr. Kirke Corin.  in 2019.  His troponins went up to 3500.  He underwent catheterization by Dr. Jacinto Halim revealing high-grade AV groove circumflex stenosis and stenosis into the proximal OM which was stented with 1 drug-eluting stent.  He had no other significant CAD and he had normal LV function.  He remains on low-dose aspirin and prasugrel.  Essential hypertension History of essential hypertension on low-dose amlodipine and metoprolol blood pressure measured today at 98/52.  Hyperlipidemia History of hyperlipidemia on rosuvastatin with lipid profile performed during his hospitalization 03/19/2023 revealing total cholesterol 177, LDL 109 and HDL 41.  Will recheck a lipid liver profile in 3  months.     Runell Gess MD FACP,FACC,FAHA, [facility] 03/30/2023 12:01 PM

## 2023-03-30 NOTE — Patient Instructions (Addendum)
Medication Instructions:  Your physician recommends that you continue on your current medications as directed. Please refer to the Current Medication list given to you today.  *If you need a refill on your cardiac medications before your next appointment, please call your pharmacy*   Lab Work: Your physician recommends that you return for lab work in: 3 months for FASTING lipid/liver panel  If you have labs (blood work) drawn today and your tests are completely normal, you will receive your results only by: MyChart Message (if you have MyChart) OR A paper copy in the mail If you have any lab test that is abnormal or we need to change your treatment, we will call you to review the results.   Follow-Up: At [facility], you and your health needs are our priority.  As part of our continuing mission to provide you with exceptional heart care, we have created designated Provider Care Teams.  These Care Teams include your primary Cardiologist (physician) and Advanced Practice Providers (APPs -  Physician Assistants and Nurse Practitioners) who all work together to provide you with the care you need, when you need it.  We recommend signing up for the patient portal called "MyChart".  Sign up information is provided on this After Visit Summary.  MyChart is used to connect with patients for Virtual Visits (Telemedicine).  Patients are able to view lab/test results, encounter notes, upcoming appointments, etc.  Non-urgent messages can be sent to your provider as well.   To learn more about what you can do with MyChart, go to [email or web address].    Your next appointment:   6 month(s)  Provider:   Micah Flesher, PA-C, Robet Leu, PA-C, Azalee Course, PA-C, Bernadene Person, NP, or Reather Littler, NP       Then, Nanetta Batty, MD will plan to see you again in 12 month(s).

## 2023-12-08 ENCOUNTER — Encounter: Payer: Self-pay | Admitting: Cardiovascular Disease

## 2023-12-08 ENCOUNTER — Ambulatory Visit: Attending: Cardiovascular Disease | Admitting: Cardiovascular Disease

## 2023-12-08 VITALS — BP 136/82 | HR 87 | Ht 66.0 in | Wt 242.0 lb

## 2023-12-08 DIAGNOSIS — I214 Non-ST elevation (NSTEMI) myocardial infarction: Secondary | ICD-10-CM

## 2023-12-08 DIAGNOSIS — E782 Mixed hyperlipidemia: Secondary | ICD-10-CM | POA: Diagnosis not present

## 2023-12-08 DIAGNOSIS — I10 Essential (primary) hypertension: Secondary | ICD-10-CM

## 2023-12-08 MED ORDER — METOPROLOL SUCCINATE ER 25 MG PO TB24: 25.0000 mg | ORAL_TABLET | Freq: Every day | ORAL | 4 refills | Status: AC

## 2023-12-08 MED ORDER — AMLODIPINE BESYLATE 2.5 MG PO TABS: 2.5000 mg | ORAL_TABLET | Freq: Every day | ORAL | 4 refills | Status: AC

## 2023-12-08 MED ORDER — ROSUVASTATIN CALCIUM 20 MG PO TABS: 20.0000 mg | ORAL_TABLET | Freq: Every day | ORAL | 4 refills | Status: AC

## 2023-12-08 MED ORDER — PRASUGREL HCL 10 MG PO TABS: 10.0000 mg | ORAL_TABLET | Freq: Every day | ORAL | 4 refills | Status: AC

## 2023-12-08 NOTE — Assessment & Plan Note (Signed)
 History of essential hypertension with blood pressure measured today at 136/82.  He is on amlodipine  and metoprolol .

## 2023-12-08 NOTE — Assessment & Plan Note (Signed)
 History of CAD status post obtuse marginal branch stenting by Dr. Darron in [facility] 2019.  He was catheterized again by Dr. Ladona 03/16/2023 and had AV groove stenting into the OM branch with balloon angioplasty and the continuation beyond the OM using a 3 mm x 22 mm long Medtronic Onyx frontier drug-eluting stent.  No other significant CAD.  He had normal LV function.  He is asymptomatic remains on low-dose aspirin  and prasugrel .

## 2023-12-08 NOTE — Progress Notes (Signed)
 12/08/2023 James Guzman   14-Mar-1960  [id number]  Primary Physician Montey Lot, PA-C Primary Cardiologist: Dorn JINNY Lesches MD [facility]I CODY [place], [place]  HPI:  James Guzman is a 63 y.o.  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  I last saw him in the office 03/30/2023.  He has a history of treated hypertension, hyperlipidemia and diabetes.  He smoked remotely and stopped in 2011.  Did have an OM stent placed by Dr. Darron in [place] in 2019.  Catheterization performed by Dr. Ladona 03/16/2023 revealed high-grade disease in the AV groove circumflex proximal to the OM takeoff as well as in the proximal OM which was stented with a 3 mm x 22 mm long Medtronic Onyx frontier drug-eluting stent.  He had no other significant CAD.  His LV function was normal.  He was discharged home on aspirin  and prasugrel .  Since I saw him 8 months ago he is remained stable.  He denies chest pain or shortness of breath.   Current Meds  Medication Sig   amLODipine  (NORVASC ) 2.5 MG tablet Take 1 tablet (2.5 mg total) by mouth daily.   aspirin  EC 81 MG tablet Take 81 mg by mouth daily.   glipiZIDE  (GLUCOTROL  XL) 5 MG 24 hr tablet Take 5 mg by mouth daily.   levothyroxine  (SYNTHROID , LEVOTHROID) 175 MCG tablet Take 175 mcg by mouth daily before breakfast.   metFORMIN (GLUCOPHAGE-XR) 500 MG 24 hr tablet Take 1,000 mg by mouth 2 (two) times daily with a meal.   metoprolol  succinate (TOPROL -XL) 25 MG 24 hr tablet Take 1 tablet (25 mg total) by mouth daily.   prasugrel  (EFFIENT ) 10 MG TABS tablet Take 1 tablet (10 mg total) by mouth daily.   rosuvastatin  (CRESTOR ) 20 MG tablet Take 1 tablet (20 mg total) by mouth daily at 6 PM.   vitamin B-12 (CYANOCOBALAMIN ) 1000 MCG tablet Take 1,000 mcg by mouth every other day.     Allergies  Allergen Reactions   Codeine Other (See Comments)    pain   Statins Other (See Comments)    Muscle aches,  Pt states it clenches up his muscles    Social History   Socioeconomic History   Marital status: Single    Spouse name: Delon    Number of children: 1   Years of education: Not on file   Highest education level: Not on file  Occupational History   Occupation: Lobbyist     Comment: [employer]   Tobacco Use   Smoking status: Former    Current packs/day: 0.00    Types: Cigarettes    Quit date: 2011    Years since quitting: 14.7   Smokeless tobacco: Never  Vaping Use   Vaping status: Never Used  Substance and Sexual Activity   Alcohol use: Not Currently    Comment: quit about 15 years ago    Drug use: Not Currently    Types: Marijuana, Cocaine    Comment: quit drugs years ago    Sexual activity: Yes  Other Topics Concern   Not on file  Social History Narrative   Not on file   Social Drivers of Health   Financial Resource Strain: Low Risk  (01/04/2018)   Overall Financial Resource Strain (CARDIA)    Difficulty of Paying Living Expenses: Not very hard  Food Insecurity: No Food Insecurity (03/20/2023)   Hunger Vital Sign  Worried About Programme researcher, broadcasting/film/video in the Last Year: Never true    Ran Out of Food in the Last Year: Never true  Transportation Needs: No Transportation Needs (03/20/2023)   PRAPARE - Administrator, Civil Service (Medical): No    Lack of Transportation (Non-Medical): No  Physical Activity: Inactive (01/04/2018)   Exercise Vital Sign    Days of Exercise per Week: 0 days    Minutes of Exercise per Session: 0 min  Stress: No Stress Concern Present (01/04/2018)   [employer] of Occupational Health - Occupational Stress Questionnaire    Feeling of Stress : Only a little  Social Connections: Unknown (01/04/2018)   Social Connection and Isolation Panel    Frequency of Communication with Friends and Family: Three times a week    Frequency of Social Gatherings with Friends and Family: Once a week    Attends Religious  Services: Never    Database administrator or Organizations: Not on file    Attends Banker Meetings: Not on file    Marital Status: Married  Intimate Partner Violence: Not At Risk (03/20/2023)   Humiliation, Afraid, Rape, and Kick questionnaire    Fear of Current or Ex-Partner: No    Emotionally Abused: No    Physically Abused: No    Sexually Abused: No     Review of Systems: General: negative for chills, fever, night sweats or weight changes.  Cardiovascular: negative for chest pain, dyspnea on exertion, edema, orthopnea, palpitations, paroxysmal nocturnal dyspnea or shortness of breath Dermatological: negative for rash Respiratory: negative for cough or wheezing Urologic: negative for hematuria Abdominal: negative for nausea, vomiting, diarrhea, bright red blood per rectum, melena, or hematemesis Neurologic: negative for visual changes, syncope, or dizziness All other systems reviewed and are otherwise negative except as noted above.    Blood pressure 136/82, pulse 87, height 5' 6 (1.676 m), weight 242 lb (109.8 kg), SpO2 96%.  General appearance: alert and no distress Neck: no adenopathy, no carotid bruit, supple, symmetrical, trachea midline, and thyroid not enlarged, symmetric, no tenderness/mass/nodules Lungs: clear to auscultation bilaterally Heart: regular rate and rhythm, S1, S2 normal, no murmur, click, rub or gallop Extremities: extremities normal, atraumatic, no cyanosis or edema Pulses: 2+ and symmetric Skin: Skin color, texture, turgor normal. No rashes or lesions Neurologic: Grossly normal  EKG EKG Interpretation Date/Time:  Tuesday December 08 2023 14:47:07 EDT Ventricular Rate:  87 PR Interval:  170 QRS Duration:  132 QT Interval:  402 QTC Calculation: 483 R Axis:   101  Text Interpretation: Normal sinus rhythm Right bundle branch block When compared with ECG of 20-Mar-2023 16:15, Left posterior fascicular block is no longer Present Criteria  for Inferior infarct are no longer Present Confirmed by Court Carrier [phone]) on 12/08/2023 2:54:44 PM    ASSESSMENT AND PLAN:   NSTEMI (non-ST elevation myocardial infarction) (HCC) History of CAD status post obtuse marginal branch stenting by Dr. Darron in [facility] 2019.  He was catheterized again by Dr. Ladona 03/16/2023 and had AV groove stenting into the OM branch with balloon angioplasty and the continuation beyond the OM using a 3 mm x 22 mm long Medtronic Onyx frontier drug-eluting stent.  No other significant CAD.  He had normal LV function.  He is asymptomatic remains on low-dose aspirin  and prasugrel .  Essential hypertension History of essential hypertension with blood pressure measured today at 136/82.  He is on amlodipine  and metoprolol .  Hyperlipidemia History of hyperlipidemia on statin  therapy with lipid profile performed 11/06/2023 revealing total cholesterol 112, LDL 32 HDL 35.     Dorn DOROTHA Lesches MD [facility], [facility] 12/08/2023 3:01 PM

## 2023-12-08 NOTE — Patient Instructions (Signed)

## 2023-12-08 NOTE — Assessment & Plan Note (Signed)
 History of hyperlipidemia on statin therapy with lipid profile performed 11/06/2023 revealing total cholesterol 112, LDL 32 HDL 35.

## 2024-06-24 ENCOUNTER — Ambulatory Visit
